# Patient Record
Sex: Male | Born: 1970 | Race: White | Hispanic: No | Marital: Single | Smoking: Current every day smoker
Health system: Southern US, Community
[De-identification: ages and names within clinical notes are randomized; demographics above are authoritative.]

## PROBLEM LIST (undated history)

## (undated) DIAGNOSIS — F1910 Other psychoactive substance abuse, uncomplicated: Secondary | ICD-10-CM

## (undated) HISTORY — PX: HERNIA REPAIR: SHX51

---

## 2016-08-27 ENCOUNTER — Encounter (HOSPITAL_COMMUNITY): Payer: Self-pay | Admitting: Emergency Medicine

## 2016-08-27 ENCOUNTER — Emergency Department (HOSPITAL_COMMUNITY)
Admission: EM | Admit: 2016-08-27 | Discharge: 2016-08-27 | Disposition: A | Discharge status: Home / Self Care | Payer: Self-pay | Attending: Emergency Medicine | Admitting: Emergency Medicine

## 2016-08-27 DIAGNOSIS — Y999 Unspecified external cause status: Secondary | ICD-10-CM | POA: Insufficient documentation

## 2016-08-27 DIAGNOSIS — W260XXA Contact with knife, initial encounter: Secondary | ICD-10-CM | POA: Insufficient documentation

## 2016-08-27 DIAGNOSIS — F17210 Nicotine dependence, cigarettes, uncomplicated: Secondary | ICD-10-CM | POA: Insufficient documentation

## 2016-08-27 DIAGNOSIS — Z23 Encounter for immunization: Secondary | ICD-10-CM | POA: Insufficient documentation

## 2016-08-27 DIAGNOSIS — Y929 Unspecified place or not applicable: Secondary | ICD-10-CM | POA: Insufficient documentation

## 2016-08-27 DIAGNOSIS — S91111A Laceration without foreign body of right great toe without damage to nail, initial encounter: Secondary | ICD-10-CM | POA: Insufficient documentation

## 2016-08-27 DIAGNOSIS — Y939 Activity, unspecified: Secondary | ICD-10-CM | POA: Insufficient documentation

## 2016-08-27 MED ORDER — LIDOCAINE HCL (PF) 1 % IJ SOLN
INTRAMUSCULAR | Status: AC
  Filled 2016-08-27: qty 5

## 2016-08-27 MED ORDER — TETANUS-DIPHTH-ACELL PERTUSSIS 5-2.5-18.5 LF-MCG/0.5 IM SUSP
0.5000 mL | Freq: Once | INTRAMUSCULAR | Status: AC
  Administered 2016-08-27: 0.5 mL via INTRAMUSCULAR
  Filled 2016-08-27: qty 0.5

## 2016-08-27 MED ORDER — CEPHALEXIN 500 MG PO CAPS: 500.0000 mg | ORAL_CAPSULE | Freq: Four times a day (QID) | ORAL | 0 refills | Status: DC

## 2016-08-27 MED ORDER — LIDOCAINE HCL (PF) 1 % IJ SOLN
30.0000 mL | Freq: Once | INTRAMUSCULAR | Status: AC
  Administered 2016-08-27: 30 mL
  Filled 2016-08-27: qty 30

## 2016-08-27 MED ORDER — BACITRACIN ZINC 500 UNIT/GM EX OINT
TOPICAL_OINTMENT | CUTANEOUS | Status: AC
  Filled 2016-08-27: qty 0.9

## 2016-08-27 NOTE — Discharge Instructions (Signed)
Take the Keflex as prescribed and be sure to complete the entire 5 day course. Return in 12-14 days to have the sutures removed. Keep the wound clean and dry and use antibiotic ointment on it for roughly 4 days. Return to emergency department sooner if you experience signs of infection to include redness, warmth, swelling, pain, discharge, red streaks of or from around your wound, or fever.

## 2016-08-27 NOTE — ED Triage Notes (Signed)
Pt has laceration on bottom of right great toe.  States it is from a knife he stuck in the ground and forgot about.

## 2016-08-27 NOTE — ED Notes (Signed)
Pt has laceration to bottom of right great toe, bleeding is controlled.

## 2016-08-27 NOTE — ED Provider Notes (Signed)
[facility] Provider Note   CSN: [id number] Arrival date & time: 08/27/16  1544   By signing my name below, I, [name], attest that this documentation has been prepared under the direction and in the presence of [name], PA-C. Electronically signed by: [name], ED Scribe. 08/27/16. 6:13 PM.  History   Chief Complaint Chief Complaint  Patient presents with  . Laceration   The history is provided by the patient. No language interpreter was used.   HPI Comments:  Derek Shields is a 45 y.o. male who presents to the Emergency Department complaining of laceration to right great toe, which occurred 2-3 hours ago. Laceration localized to inferior portion of toe. Pt reports that he stuck a knife in the ground and forgot about it and then cut his foot on it. Associated symptoms include controlled bleeding and tingling. Denies pain to affected area, numbness, fevers, chills or vomiting. Pt is unsure of last tetanus vaccination.  History reviewed. No pertinent past medical history.  There are no active problems to display for this patient.   History reviewed. No pertinent surgical history.     Home Medications    Prior to Admission medications   Medication Sig Start Date End Date Taking? Authorizing Provider  cephALEXin (KEFLEX) 500 MG capsule Take 1 capsule (500 mg total) by mouth 4 (four) times daily. 08/27/16   Jerre Simon, PA    Family History History reviewed. No pertinent family history.  Social History Social History  Substance Use Topics  . Smoking status: Current Every Day Smoker    Packs/day: 1.00    Types: Cigarettes  . Smokeless tobacco: Not on file  . Alcohol use Yes     Comment: daily     Allergies   Review of patient's allergies indicates no known allergies.   Review of Systems Review of Systems  Constitutional: Negative for chills and fever.  Gastrointestinal: Negative for vomiting.  Skin: Positive for wound ( laceration to right  great toe).  Neurological: Negative for numbness.     Physical Exam Updated Vital Signs BP 156/91 (BP Location: Left Arm)   Pulse 92   Temp 98.6 F (37 C) (Oral)   Resp 16   Ht 5\' 10"  (1.778 m)   Wt 180 lb (81.6 kg)   SpO2 99%   BMI 25.83 kg/m   Physical Exam  Constitutional: He appears well-developed and well-nourished. No distress.  HENT:  Head: Normocephalic and atraumatic.  Eyes: Conjunctivae are normal.  Cardiovascular:  Pulses:      Dorsalis pedis pulses are 2+ on the right side, and 2+ on the left side.  Pulmonary/Chest: Effort normal. No respiratory distress.  Musculoskeletal: Normal range of motion.  Neurological: He is alert. Coordination normal.  Sensations intact. Pt neurovascularly intact.  Skin: Skin is warm and dry. He is not diaphoretic.  3 cm laceration to plantar aspect of right great toe, bleeding controlled, no signs of surrounding infection.  Psychiatric: He has a normal mood and affect. His behavior is normal.  Nursing note and vitals reviewed.    ED Treatments / Results  DIAGNOSTIC STUDIES:  Oxygen Saturation is 99% on room air, normal by my interpretation.    COORDINATION OF CARE:  5:06 PM Discussed treatment plan with pt at bedside, which includes irrigation and sutures, and pt agreed to plan. Will update tetanus vaccination.  Labs (all labs ordered are listed, but only abnormal results are displayed) Labs Reviewed - No data to display  EKG  EKG Interpretation None       Radiology No results found.  Procedures .Marland Kitchen.Laceration Repair Date/Time: 08/27/2016 6:09 PM Performed by: Jerre SimonFOCHT, JESSICA L Authorized by: Glynn OctaveANCOUR, STEPHEN   Consent:    Consent obtained:  Verbal   Consent given by:  Patient   Risks discussed:  Infection (bleeding)   Alternatives discussed: Dermabond. Anesthesia (see MAR for exact dosages):    Anesthesia method:  Local infiltration   Local anesthetic:  Lidocaine 1% w/o epi Laceration details:    Location:   Toe   Toe location:  R big toe   Length (cm):  3 Repair type:    Repair type:  Simple Pre-procedure details:    Preparation:  Patient was prepped and draped in usual sterile fashion Exploration:    Wound exploration: wound explored through full range of motion and entire depth of wound probed and visualized     Contaminated: no   Treatment:    Area cleansed with:  Betadine   Amount of cleaning:  Standard   Irrigation solution:  Sterile saline   Irrigation method:  Syringe Mucous membrane repair:    Wound mucous membrane closure material used: Prolene. Skin repair:    Repair method:  Sutures   Suture size:  3-0   Suture material:  Prolene   Suture technique:  Simple interrupted   Number of sutures:  6 Approximation:    Approximation:  Close   Vermilion border: well-aligned   Post-procedure details:    Dressing:  Antibiotic ointment and sterile dressing   Patient tolerance of procedure:  Tolerated well, no immediate complications   (including critical care time)  Medications Ordered in ED Medications  lidocaine (PF) (XYLOCAINE) 1 % injection (not administered)  bacitracin 500 UNIT/GM ointment (not administered)  lidocaine (PF) (XYLOCAINE) 1 % injection 30 mL (30 mLs Infiltration Given by Other 08/27/16 1812)  Tdap (BOOSTRIX) injection 0.5 mL (0.5 mLs Intramuscular Given 08/27/16 1809)     Initial Impression / Assessment and Plan / ED Course  I have reviewed the triage vital signs and the nursing notes.  Pertinent labs & imaging results that were available during my care of the patient were reviewed by me and considered in my medical decision making (see chart for details).  Clinical Course   Tdap booster given.Pressure irrigation performed. Laceration occurred < 8 hours prior to repair which was well tolerated. Pt has no co morbidities to effect normal wound healing. Pt is a smoker so will d/c with 5 days of Keflex and because lac is on his toe. Discussed suture home care w  pt and answered questions. Pt to f-u for wound check and suture removal in 12 days. Discussed strict return precautions to include signs of infection. Pt is hemodynamically stable w no complaints prior to dc and expresses understanding to the discharge instructions.  I personally performed the services described in this documentation, which was scribed in my presence. The recorded information has been reviewed and is accurate.    Final Clinical Impressions(s) / ED Diagnoses   Final diagnoses:  Laceration of right great toe, initial encounter    New Prescriptions Discharge Medication List as of 08/27/2016  6:14 PM    START taking these medications   Details  cephALEXin (KEFLEX) 500 MG capsule Take 1 capsule (500 mg total) by mouth 4 (four) times daily., Starting Mon 08/27/2016, Print          Joyce CopaJessica L HalaulaFocht, GeorgiaPA 08/27/16 1904    Glynn OctaveStephen Rancour, MD 08/28/16 [phone]

## 2017-01-03 ENCOUNTER — Encounter (HOSPITAL_COMMUNITY): Payer: Self-pay | Admitting: *Deleted

## 2017-01-03 ENCOUNTER — Emergency Department (HOSPITAL_COMMUNITY)
Admission: EM | Admit: 2017-01-03 | Discharge: 2017-01-03 | Disposition: A | Discharge status: Home / Self Care | Payer: Self-pay | Attending: Emergency Medicine | Admitting: Emergency Medicine

## 2017-01-03 DIAGNOSIS — Y999 Unspecified external cause status: Secondary | ICD-10-CM | POA: Insufficient documentation

## 2017-01-03 DIAGNOSIS — W25XXXA Contact with sharp glass, initial encounter: Secondary | ICD-10-CM | POA: Insufficient documentation

## 2017-01-03 DIAGNOSIS — F1290 Cannabis use, unspecified, uncomplicated: Secondary | ICD-10-CM | POA: Insufficient documentation

## 2017-01-03 DIAGNOSIS — Y929 Unspecified place or not applicable: Secondary | ICD-10-CM | POA: Insufficient documentation

## 2017-01-03 DIAGNOSIS — F17210 Nicotine dependence, cigarettes, uncomplicated: Secondary | ICD-10-CM | POA: Insufficient documentation

## 2017-01-03 DIAGNOSIS — Y9389 Activity, other specified: Secondary | ICD-10-CM | POA: Insufficient documentation

## 2017-01-03 DIAGNOSIS — S81811A Laceration without foreign body, right lower leg, initial encounter: Secondary | ICD-10-CM | POA: Insufficient documentation

## 2017-01-03 DIAGNOSIS — Z792 Long term (current) use of antibiotics: Secondary | ICD-10-CM | POA: Insufficient documentation

## 2017-01-03 MED ORDER — LIDOCAINE HCL (PF) 2 % IJ SOLN
10.0000 mL | Freq: Once | INTRAMUSCULAR | Status: AC
  Administered 2017-01-03: 10 mL via INTRADERMAL
  Filled 2017-01-03: qty 10

## 2017-01-03 NOTE — ED Notes (Signed)
Called to place patient in room. No answer. Patient not in waiting room at this time.

## 2017-01-03 NOTE — ED Notes (Signed)
Called to place patient in room. No answer. Patient not in waiting area.

## 2017-01-03 NOTE — ED Provider Notes (Signed)
[facility] Provider Note   CSN: [id number] Arrival date & time: 01/03/17  1545     History   Chief Complaint Chief Complaint  Patient presents with  . Extremity Laceration    HPI Derek Shields is a 46 y.o. male.  HPI   Derek Shields is a 46 y.o. male who presents to the Emergency Department complaining of laceration to his right lower leg from a piece of broken glass. Incident occurred shortly before ER arrival. He denies pain, swelling, or numbness or difficulty flexing or extending his foot. Last Td was last year. He denies foreign bodies or use of blood thinners. Patient admits to drinking alcohol earlier today.  History reviewed. No pertinent past medical history.  There are no active problems to display for this patient.   Past Surgical History:  Procedure Laterality Date  . HERNIA REPAIR         Home Medications    Prior to Admission medications   Medication Sig Start Date End Date Taking? Authorizing Provider  cephALEXin (KEFLEX) 500 MG capsule Take 1 capsule (500 mg total) by mouth 4 (four) times daily. 08/27/16   Jerre SimonJessica L Focht, PA    Family History No family history on file.  Social History Social History  Substance Use Topics  . Smoking status: Current Every Day Smoker    Packs/day: 1.50    Types: Cigarettes  . Smokeless tobacco: Never Used  . Alcohol use Yes     Comment: daily     Allergies   Patient has no known allergies.   Review of Systems Review of Systems  Constitutional: Negative for chills and fever.  Musculoskeletal: Negative for arthralgias, back pain and joint swelling.  Skin: Positive for wound.       Laceration right lower leg  Neurological: Negative for dizziness, weakness and numbness.  Hematological: Does not bruise/bleed easily.  All other systems reviewed and are negative.    Physical Exam Updated Vital Signs BP 134/65 (BP Location: Left Arm)   Pulse 78   Temp 98.1 F (36.7 C) (Oral)   Resp 18   Ht 5'  10" (1.778 m)   Wt 77.1 kg   SpO2 97%   BMI 24.39 kg/m   Physical Exam  Constitutional: He is oriented to person, place, and time. He appears well-developed and well-nourished. No distress.  HENT:  Head: Normocephalic and atraumatic.  Cardiovascular: Normal rate, regular rhythm and intact distal pulses.   No murmur heard. Pulmonary/Chest: Effort normal and breath sounds normal. No respiratory distress.  Musculoskeletal: Normal range of motion. He exhibits tenderness. He exhibits no edema.  4 cm laceration to the posterior right lower leg.  Bleeding controlled.  Wound explored, no muscle or tendon injury observed.  Achilles tendon appears intact  Neurological: He is alert and oriented to person, place, and time. He exhibits normal muscle tone. Coordination normal.  Skin: Skin is warm.  Nursing note and vitals reviewed.    ED Treatments / Results  Labs (all labs ordered are listed, but only abnormal results are displayed) Labs Reviewed - No data to display  EKG  EKG Interpretation None       Radiology No results found.  Procedures Procedures (including critical care time)  LACERATION REPAIR Performed by: Leina Babe L. Authorized by: Maxwell CaulRIPLETT,Icker Swigert L. Consent: Verbal consent obtained. Risks and benefits: risks, benefits and alternatives were discussed Consent given by: patient Patient identity confirmed: provided demographic data Prepped and Draped in normal sterile fashion Wound explored  Laceration Location:  right lower leg  Laceration Length: 4 cm  No Foreign Bodies seen or palpated  Anesthesia: local infiltration  Local anesthetic: lidocaine 2 % w/o epinephrine  Anesthetic total: 3 ml  Irrigation method: syringe Amount of cleaning: standard  Skin closure: staples  Number of staples: 10   Technique: stapling  Patient tolerance: Patient tolerated the procedure well with no immediate complications.   Medications Ordered in ED Medications    lidocaine (XYLOCAINE) 2 % injection 10 mL (not administered)     Initial Impression / Assessment and Plan / ED Course  I have reviewed the triage vital signs and the nursing notes.  Pertinent labs & imaging results that were available during my care of the patient were reviewed by me and considered in my medical decision making (see chart for details).  Clinical Course     Patient smells of alcohol, but does not appear intoxicated.   vital signs are stable. He has contacted a family member to drive him home.  Td up to date. Wound was explored prior to closure, no muscle or tendon injury seen or palpated. Wound was bandaged. Patient stable for discharge agrees to wound care instructions and staples out in 10 days. patient ambulates with steady gait.    Agrees to use Tylenol or ibuprofen for pain.  Final Clinical Impressions(s) / ED Diagnoses   Final diagnoses:  Laceration of right lower leg, initial encounter    New Prescriptions New Prescriptions   No medications on file     Pauline Aus, Cordelia Poche 01/03/17 1813    Linwood Dibbles, MD 01/07/17 2200

## 2017-01-03 NOTE — ED Triage Notes (Signed)
Pt comes in for right calf laceration. Pt states he stuck his leg through a door then he said he did it cutting wood. Pt has ETOH on board. Bleeding is controlled and leg is wrapped in triage.

## 2017-01-03 NOTE — Discharge Instructions (Signed)
Clean the wound with mild soap and water.  Keep the wound clean and bandaged.  Staples out in 10 days

## 2017-01-16 ENCOUNTER — Emergency Department (HOSPITAL_COMMUNITY)
Admission: EM | Admit: 2017-01-16 | Discharge: 2017-01-16 | Disposition: A | Discharge status: Home / Self Care | Payer: Self-pay | Attending: Emergency Medicine | Admitting: Emergency Medicine

## 2017-01-16 ENCOUNTER — Encounter (HOSPITAL_COMMUNITY): Payer: Self-pay | Admitting: Emergency Medicine

## 2017-01-16 DIAGNOSIS — Z4802 Encounter for removal of sutures: Secondary | ICD-10-CM | POA: Insufficient documentation

## 2017-01-16 DIAGNOSIS — F17210 Nicotine dependence, cigarettes, uncomplicated: Secondary | ICD-10-CM | POA: Insufficient documentation

## 2017-01-16 NOTE — ED Notes (Signed)
RN removed 10 staples at bedside.

## 2017-01-16 NOTE — Discharge Instructions (Signed)
Clean with mild soap and water and keep it bandaged when working.  Return if needed

## 2017-01-16 NOTE — ED Triage Notes (Signed)
Here to have staples removed from right leg.

## 2017-01-19 NOTE — ED Provider Notes (Signed)
[facility] Provider Note   CSN: [id number] Arrival date & time: 01/16/17  1842     History   Chief Complaint Chief Complaint  Patient presents with  . Suture / Staple Removal    HPI Derek Shields is a 46 y.o. male.  HPI   Derek Shields is a 46 y.o. male who presents to the Emergency Department requesting staple removal.  He was seen here on 01/03/17 and treated by me for a laceration to the posterior right leg.  He complains of redness around the staples.  He denies bleeding, drainage, and swelling.  He has been cleaning the area with soap and water and applying a topical first aid spray.   History reviewed. No pertinent past medical history.  There are no active problems to display for this patient.   Past Surgical History:  Procedure Laterality Date  . HERNIA REPAIR         Home Medications    Prior to Admission medications   Medication Sig Start Date End Date Taking? Authorizing Provider  cephALEXin (KEFLEX) 500 MG capsule Take 1 capsule (500 mg total) by mouth 4 (four) times daily. 08/27/16   Jerre SimonJessica L Focht, PA    Family History History reviewed. No pertinent family history.  Social History Social History  Substance Use Topics  . Smoking status: Current Every Day Smoker    Packs/day: 1.50    Types: Cigarettes  . Smokeless tobacco: Never Used  . Alcohol use Yes     Comment: daily     Allergies   Patient has no known allergies.   Review of Systems Review of Systems  Constitutional: Negative for chills and fever.  Musculoskeletal: Negative for arthralgias, back pain and joint swelling.  Skin: Positive for wound.       Laceration right posterior left with staples in place  Neurological: Negative for dizziness, weakness and numbness.  Hematological: Does not bruise/bleed easily.  All other systems reviewed and are negative.    Physical Exam Updated Vital Signs BP 138/93 (BP Location: Left Arm)   Pulse 72   Resp 15   Ht 5\' 10"  (1.778 m)    Wt 77.1 kg   SpO2 100%   BMI 24.39 kg/m   Physical Exam  Constitutional: He is oriented to person, place, and time. He appears well-developed and well-nourished. No distress.  HENT:  Head: Normocephalic and atraumatic.  Cardiovascular: Normal rate, regular rhythm and intact distal pulses.   No murmur heard. Pulmonary/Chest: Effort normal and breath sounds normal. No respiratory distress.  Musculoskeletal: He exhibits no edema or tenderness.  Neurological: He is alert and oriented to person, place, and time. He exhibits normal muscle tone. Coordination normal.  Skin: Skin is warm. Laceration noted.  Laceration to right posterior lower leg with staples in place.  Appears well healed.  Mild surrounding erythema w/o edema or drainage.  Nursing note and vitals reviewed.    ED Treatments / Results  Labs (all labs ordered are listed, but only abnormal results are displayed) Labs Reviewed - No data to display  EKG  EKG Interpretation None       Radiology No results found.  Procedures Procedures (including critical care time)  Medications Ordered in ED Medications - No data to display   Initial Impression / Assessment and Plan / ED Course  I have reviewed the triage vital signs and the nursing notes.  Pertinent labs & imaging results that were available during my care of the patient were reviewed by  me and considered in my medical decision making (see chart for details).     Staples removed by nursing staff w/o difficulty.,  Erythema likely result of delayed staple removal.  NV intact.  Wound care instructions given.  Final Clinical Impressions(s) / ED Diagnoses   Final diagnoses:  Encounter for staple removal    New Prescriptions Discharge Medication List as of 01/16/2017  7:53 PM       Arriyana Rodell Trisha Mangle, PA-C 01/19/17 1732    Marily Memos, MD 01/20/17 [phone]

## 2017-07-27 ENCOUNTER — Encounter (HOSPITAL_COMMUNITY): Payer: Self-pay

## 2017-07-27 ENCOUNTER — Emergency Department (HOSPITAL_COMMUNITY): Payer: Self-pay

## 2017-07-27 ENCOUNTER — Emergency Department (HOSPITAL_COMMUNITY)
Admission: EM | Admit: 2017-07-27 | Discharge: 2017-07-28 | Disposition: A | Discharge status: Home / Self Care | Payer: Self-pay | Attending: Emergency Medicine | Admitting: Emergency Medicine

## 2017-07-27 DIAGNOSIS — W28XXXA Contact with powered lawn mower, initial encounter: Secondary | ICD-10-CM | POA: Insufficient documentation

## 2017-07-27 DIAGNOSIS — F17210 Nicotine dependence, cigarettes, uncomplicated: Secondary | ICD-10-CM | POA: Insufficient documentation

## 2017-07-27 DIAGNOSIS — Y999 Unspecified external cause status: Secondary | ICD-10-CM | POA: Insufficient documentation

## 2017-07-27 DIAGNOSIS — Y929 Unspecified place or not applicable: Secondary | ICD-10-CM | POA: Insufficient documentation

## 2017-07-27 DIAGNOSIS — S61212A Laceration without foreign body of right middle finger without damage to nail, initial encounter: Secondary | ICD-10-CM | POA: Insufficient documentation

## 2017-07-27 DIAGNOSIS — Y9389 Activity, other specified: Secondary | ICD-10-CM | POA: Insufficient documentation

## 2017-07-27 MED ORDER — LIDOCAINE HCL (PF) 1 % IJ SOLN
5.0000 mL | Freq: Once | INTRAMUSCULAR | Status: DC
  Filled 2017-07-27: qty 5

## 2017-07-27 MED ORDER — POVIDONE-IODINE 10 % EX SOLN
CUTANEOUS | Status: AC
  Filled 2017-07-27: qty 15

## 2017-07-27 NOTE — ED Notes (Signed)
Pt lifted flap on mower & blade hit the tip of his middle finger.

## 2017-07-27 NOTE — ED Triage Notes (Addendum)
Reports of cutting right middle index finger with lawn mower blade this evening. Denies pain. Bleeding controlled. Patient has ETOH on board.

## 2017-07-27 NOTE — ED Provider Notes (Signed)
[facility] Provider Note   CSN: [id number] Arrival date & time: 07/27/17  2249     History   Chief Complaint Chief Complaint  Patient presents with  . Laceration    HPI Derek Balleraul Tubbs is a 46 y.o. male.  HPI   Derek Balleraul Shields is a 46 y.o. male who presents to the Emergency Department complaining of laceration to the distal tip of the right middle finger.  States that he was working on a Charity fundraiserlawn mower blade when the incident occurred earlier this evening.  He denies numbness or pain to the finger, no hx of blood thinners.  Td is up to date. 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     History reviewed. No pertinent past medical history.  There are no active problems to display for this patient.   Past Surgical History:  Procedure Laterality Date  . HERNIA REPAIR         Home Medications    Prior to Admission medications   Medication Sig Start Date End Date Taking? Authorizing Provider  cephALEXin (KEFLEX) 500 MG capsule Take 1 capsule (500 mg total) by mouth 4 (four) times daily. 08/27/16   Jerre SimonFocht, Jessica L, PA    Family History No family history on file.  Social History Social History  Substance Use Topics  . Smoking status: Current Every Day Smoker    Packs/day: 1.50    Types: Cigarettes  . Smokeless tobacco: Never Used  . Alcohol use Yes     Comment: daily     Allergies   Patient has no known allergies.   Review of Systems Review of Systems  Constitutional: Negative for chills and fever.  Musculoskeletal: Negative for arthralgias, back pain and joint swelling.  Skin: Positive for wound.       Laceration   Neurological: Negative for dizziness, weakness and numbness.  Hematological: Does not bruise/bleed easily.  All other systems reviewed and are negative.    Physical Exam Updated Vital Signs BP (!) 133/102   Pulse (!) 113   Temp 99 F (37.2 C) (Oral)   Resp 15    Ht 5\' 10"  (1.778 m)   Wt 74.8 kg (165 lb)   SpO2 96%   BMI 23.68 kg/m   Physical Exam  Constitutional: He is oriented to person, place, and time. He appears well-developed and well-nourished. No distress.  HENT:  Head: Normocephalic and atraumatic.  Cardiovascular: Normal rate, regular rhythm and intact distal pulses.   Pulmonary/Chest: Effort normal and breath sounds normal. No respiratory distress.  Musculoskeletal: Normal range of motion. He exhibits no edema or tenderness.  Neurological: He is alert and oriented to person, place, and time. No sensory deficit. He exhibits normal muscle tone. Coordination normal.  Skin: Skin is warm. Capillary refill takes less than 2 seconds. Laceration noted.  2 cm Flap type laceration to the distal tip of the right middle finger.  No FB's or nail involvement.  Bleeding controlled.  Pt has full ROM of the finger.    Nursing note and vitals reviewed.    ED Treatments / Results  Labs (all labs ordered are listed, but only abnormal results are displayed) Labs Reviewed - No data to display  EKG  EKG Interpretation None       Radiology Dg Finger Middle Right  Result Date: 07/27/2017 CLINICAL DATA:  Laceration to the right middle finger from lawnmower blade, acute onset.  Initial encounter. EXAM: RIGHT MIDDLE FINGER 2+V COMPARISON:  None. FINDINGS: The known soft tissue laceration is not well characterized on radiograph. No radiopaque foreign bodies are seen. There is no definite evidence of osseous disruption. Visualized joint spaces are preserved. IMPRESSION: No radiopaque foreign bodies seen. No definite evidence of osseous disruption. Electronically Signed   By: Roanna RaiderJeffery  Chang M.D.   On: 07/27/2017 23:26    Procedures Procedures (including critical care time)  LACERATION REPAIR Performed by: Allan Minotti L. Authorized by: Maxwell CaulRIPLETT,Jamon Hayhurst L. Consent: Verbal consent obtained. Risks and benefits: risks, benefits and alternatives were  discussed Consent given by: patient Patient identity confirmed: provided demographic data Prepped and Draped in normal sterile fashion Wound explored  Laceration Location: distal tip of right middle finger  Laceration Length: 2 cm  No Foreign Bodies seen or palpated  Anesthesia: local infiltration  Local anesthetic: lidocaine 1 % w/o epinephrine  Anesthetic total: 2 ml  Irrigation method: syringe Amount of cleaning: standard  Skin closure: 4-0 ethilon  Number of sutures: 3   Technique: simple interrupted  Patient tolerance: Patient tolerated the procedure well with no immediate complications.   Medications Ordered in ED Medications  lidocaine (PF) (XYLOCAINE) 1 % injection 5 mL (not administered)     Initial Impression / Assessment and Plan / ED Course  I have reviewed the triage vital signs and the nursing notes.  Pertinent labs & imaging results that were available during my care of the patient were reviewed by me and considered in my medical decision making (see chart for details).     Flap type lac to distal tip of the finger.  NV intact.  No motor deficits. Bleeding controlled.  Td up to date.    Wound loosely approximated.  Finger splint applied.  Wound care instructions discussed.  Sutures out in 7 days.   Final Clinical Impressions(s) / ED Diagnoses   Final diagnoses:  Laceration of right middle finger without foreign body without damage to nail, initial encounter    New Prescriptions New Prescriptions   No medications on file     Pauline Ausriplett, Lavontay Kirk, Cordelia Poche-C 07/28/17 0049    Dione BoozeGlick, David, MD 07/28/17 [phone]

## 2017-07-28 MED ORDER — SULFAMETHOXAZOLE-TRIMETHOPRIM 800-160 MG PO TABS: 1.0000 | ORAL_TABLET | Freq: Two times a day (BID) | ORAL | 0 refills | Status: AC

## 2017-07-28 MED ORDER — BACITRACIN ZINC 500 UNIT/GM EX OINT
TOPICAL_OINTMENT | CUTANEOUS | Status: DC
Start: 2017-07-28 — End: 2017-07-28
  Filled 2017-07-28: qty 0.9

## 2017-07-28 NOTE — ED Notes (Signed)
antibiotic ointment applied & sterile dressing to the right middle finger.

## 2017-07-28 NOTE — Discharge Instructions (Signed)
Clean the wound with mild soap and water.  Keep it bandaged, splint as needed.  Sutures out in 10 days.  Return here for any signs of infection

## 2017-07-28 NOTE — ED Notes (Signed)
Pt alert & oriented x4, stable gait. Patient given discharge instructions, paperwork & prescription(s). Patient  instructed to stop at the registration desk to finish any additional paperwork. Patient verbalized understanding. Pt left department w/ no further questions. 

## 2018-01-11 IMAGING — DX DG FINGER MIDDLE 2+V*R*
3 series · 3 of 3 positions shown · non-contrast
Comparison: None.

CLINICAL DATA: Laceration to the right middle finger from lawnmower
blade, acute onset. Initial encounter.

EXAM:
RIGHT MIDDLE FINGER 2+V

[finger obl]
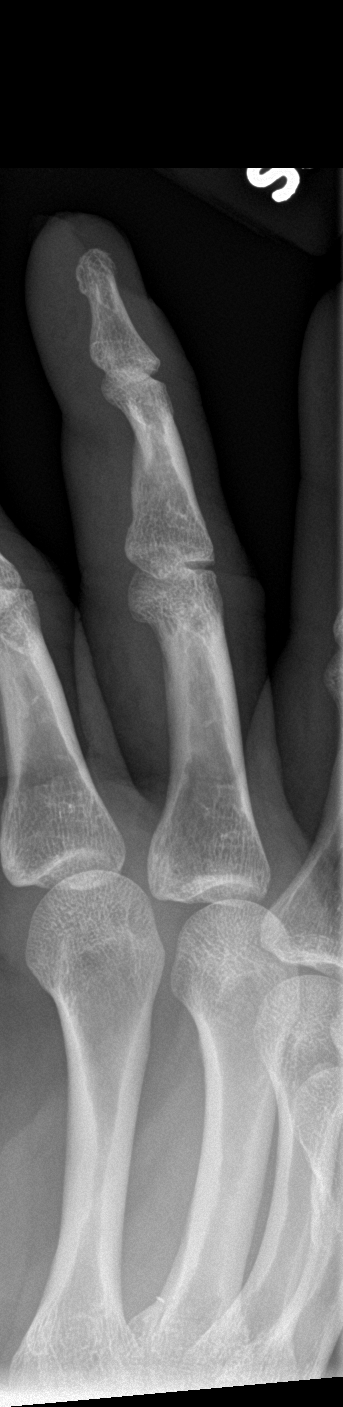

[finger lat]
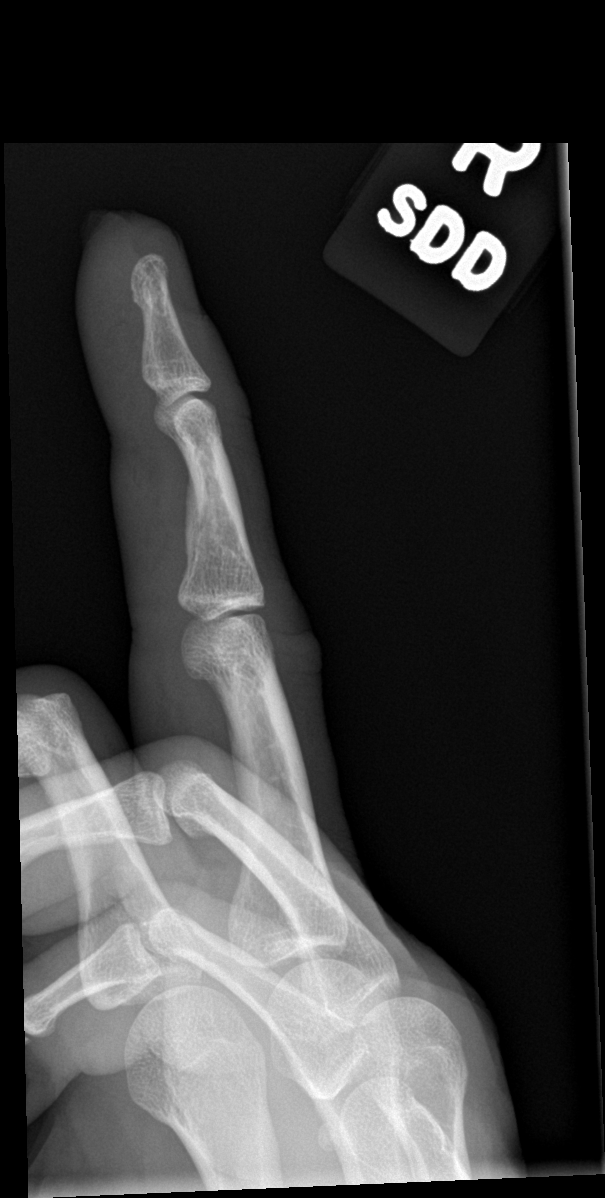

[finger ap]
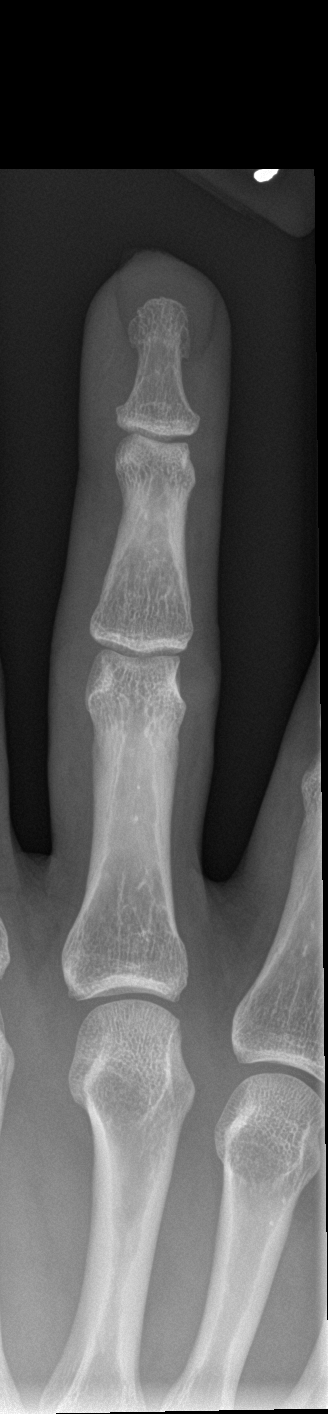

[3 of 3 positions shown; findings below may reference images not displayed]

FINDINGS: The known soft tissue laceration is not well characterized on
radiograph. No radiopaque foreign bodies are seen. There is no
definite evidence of osseous disruption. Visualized joint spaces are
preserved.
IMPRESSION: No radiopaque foreign bodies seen. No definite evidence of osseous
disruption.

## 2019-05-25 ENCOUNTER — Encounter (HOSPITAL_COMMUNITY): Payer: Self-pay | Admitting: Emergency Medicine

## 2019-05-25 ENCOUNTER — Emergency Department (HOSPITAL_COMMUNITY): Payer: Self-pay

## 2019-05-25 ENCOUNTER — Other Ambulatory Visit: Payer: Self-pay

## 2019-05-25 ENCOUNTER — Emergency Department (HOSPITAL_COMMUNITY)
Admission: EM | Admit: 2019-05-25 | Discharge: 2019-05-25 | Disposition: A | Discharge status: Home / Self Care | Payer: Self-pay | Attending: Emergency Medicine | Admitting: Emergency Medicine

## 2019-05-25 DIAGNOSIS — F17210 Nicotine dependence, cigarettes, uncomplicated: Secondary | ICD-10-CM | POA: Insufficient documentation

## 2019-05-25 DIAGNOSIS — F1210 Cannabis abuse, uncomplicated: Secondary | ICD-10-CM | POA: Insufficient documentation

## 2019-05-25 DIAGNOSIS — R0789 Other chest pain: Secondary | ICD-10-CM | POA: Insufficient documentation

## 2019-05-25 DIAGNOSIS — F1410 Cocaine abuse, uncomplicated: Secondary | ICD-10-CM | POA: Insufficient documentation

## 2019-05-25 LAB — BASIC METABOLIC PANEL
Anion gap: 11 (ref 5–15)
BUN: 10 mg/dL (ref 6–20)
CO2: 22 mmol/L (ref 22–32)
Calcium: 9 mg/dL (ref 8.9–10.3)
Chloride: 110 mmol/L (ref 98–111)
Creatinine, Ser: 0.72 mg/dL (ref 0.61–1.24)
GFR calc Af Amer: >60 mL/min (ref >60)
GFR calc non Af Amer: >60 mL/min (ref >60)
Glucose, Bld: 84 mg/dL (ref 70–99)
Potassium: 4 mmol/L (ref 3.5–5.1)
Sodium: 143 mmol/L (ref 135–145)

## 2019-05-25 LAB — CBC
HCT: 43.9 % (ref 39.0–52.0)
Hemoglobin: 15 g/dL (ref 13.0–17.0)
MCH: 34.1 pg — ABNORMAL HIGH (ref 26.0–34.0)
MCHC: 34.2 g/dL (ref 30.0–36.0)
MCV: 99.8 fL (ref 80.0–100.0)
Platelets: 305 10*3/uL (ref 150–400)
RBC: 4.4 MIL/uL (ref 4.22–5.81)
RDW: 14 % (ref 11.5–15.5)
WBC: 8.9 10*3/uL (ref 4.0–10.5)
nRBC: 0 % (ref 0.0–0.2)

## 2019-05-25 LAB — DIFFERENTIAL
Abs Immature Granulocytes: 0.04 10*3/uL (ref 0.00–0.07)
Basophils Absolute: 0.1 10*3/uL (ref 0.0–0.1)
Basophils Relative: 1 %
Eosinophils Absolute: 0.4 10*3/uL (ref 0.0–0.5)
Eosinophils Relative: 5 %
Immature Granulocytes: 1 %
Lymphocytes Relative: 32 %
Lymphs Abs: 2.8 10*3/uL (ref 0.7–4.0)
Monocytes Absolute: 1 10*3/uL (ref 0.1–1.0)
Monocytes Relative: 12 %
Neutro Abs: 4.4 10*3/uL (ref 1.7–7.7)
Neutrophils Relative %: 49 %

## 2019-05-25 LAB — HEPATIC FUNCTION PANEL
ALT: 25 U/L (ref 0–44)
AST: 32 U/L (ref 15–41)
Albumin: 3.8 g/dL (ref 3.5–5.0)
Alkaline Phosphatase: 39 U/L (ref 38–126)
Bilirubin, Direct: 0.1 mg/dL (ref 0.0–0.2)
Indirect Bilirubin: 0.3 mg/dL (ref 0.3–0.9)
Total Bilirubin: 0.4 mg/dL (ref 0.3–1.2)
Total Protein: 7.4 g/dL (ref 6.5–8.1)

## 2019-05-25 LAB — ETHANOL: Alcohol, Ethyl (B): 281 mg/dL — ABNORMAL HIGH (ref <10)

## 2019-05-25 LAB — TROPONIN I: Troponin I: <0.03 ng/mL (ref <0.03)

## 2019-05-25 MED ORDER — IBUPROFEN 800 MG PO TABS: 800.0000 mg | ORAL_TABLET | Freq: Three times a day (TID) | ORAL | 0 refills | Status: DC | PRN

## 2019-05-25 MED ORDER — ACETAMINOPHEN 325 MG PO TABS
650.0000 mg | ORAL_TABLET | Freq: Once | ORAL | Status: AC
  Administered 2019-05-25: 02:00:00 650 mg via ORAL
  Filled 2019-05-25: qty 2

## 2019-05-25 NOTE — ED Notes (Signed)
Patient transported to X-ray 

## 2019-05-25 NOTE — ED Triage Notes (Signed)
Pt with L sided chest pain that radiates to L. Shoulder and arm.

## 2019-05-25 NOTE — ED Provider Notes (Signed)
[facility]Y [facility]PART[facility] Provider Note   CSN: [id number] Arrival date & time: 05/25/19  0136    History   Chief Complaint Chief Complaint  Patient presents with  . Chest Pain    HPI Derek Shields is a 48 y.o. male.     Patient complains of left-sided chest wall pain with movement  The history is provided by the patient. No language interpreter was used.  Chest Pain  Pain location:  L chest Pain quality: aching   Pain radiates to:  Does not radiate Pain severity:  Moderate Onset quality:  Sudden Timing:  Intermittent Progression:  Worsening Chronicity:  New Context: not breathing   Relieved by:  Nothing Associated symptoms: no abdominal pain, no back pain, no cough, no fatigue and no headache     History reviewed. No pertinent past medical history.  There are no active problems to display for this patient.   Past Surgical History:  Procedure Laterality Date  . HERNIA REPAIR          Home Medications    Prior to Admission medications   Medication Sig Start Date End Date Taking? Authorizing Provider  cephALEXin (KEFLEX) 500 MG capsule Take 1 capsule (500 mg total) by mouth 4 (four) times daily. 08/27/16   Focht, Joyce CopaJessica L, PA  ibuprofen (ADVIL) 800 MG tablet Take 1 tablet (800 mg total) by mouth every 8 (eight) hours as needed. 05/25/19   Bethann BerkshireZammit, Deara Bober, MD    Family History No family history on file.  Social History Social History   Tobacco Use  . Smoking status: Current Every Day Smoker    Packs/day: 1.50    Types: Cigarettes  . Smokeless tobacco: Never Used  Substance Use Topics  . Alcohol use: Yes    Comment: daily  . Drug use: Yes    Types: Marijuana, Cocaine     Allergies   Patient has no known allergies.   Review of Systems Review of Systems  Constitutional: Negative for appetite change and fatigue.  HENT: Negative for congestion, ear discharge and sinus pressure.   Eyes: Negative for discharge.  Respiratory: Negative for  cough.   Cardiovascular: Positive for chest pain.  Gastrointestinal: Negative for abdominal pain and diarrhea.  Genitourinary: Negative for frequency and hematuria.  Musculoskeletal: Negative for back pain.  Skin: Negative for rash.  Neurological: Negative for seizures and headaches.  Psychiatric/Behavioral: Negative for hallucinations.     Physical Exam Updated Vital Signs BP 123/71   Pulse 66   Temp 98 F (36.7 C) (Oral)   Resp 18   Ht 5\' 11"  (1.803 m)   Wt 77.1 kg   SpO2 96%   BMI 23.71 kg/m   Physical Exam Vitals signs and nursing note reviewed.  Constitutional:      Appearance: He is well-developed.  HENT:     Head: Normocephalic.     Nose: Nose normal.  Eyes:     General: No scleral icterus.    Conjunctiva/sclera: Conjunctivae normal.  Neck:     Musculoskeletal: Neck supple.     Thyroid: No thyromegaly.  Cardiovascular:     Rate and Rhythm: Normal rate and regular rhythm.     Heart sounds: No murmur. No friction rub. No gallop.   Pulmonary:     Breath sounds: No stridor. No wheezing or rales.  Chest:     Chest wall: No tenderness.  Abdominal:     General: There is no distension.     Tenderness: There is no abdominal  tenderness. There is no rebound.  Musculoskeletal: Normal range of motion.     Comments: Tender left chest  Lymphadenopathy:     Cervical: No cervical adenopathy.  Skin:    Findings: No erythema or rash.  Neurological:     Mental Status: He is oriented to person, place, and time.     Motor: No abnormal muscle tone.     Coordination: Coordination normal.  Psychiatric:        Behavior: Behavior normal.      ED Treatments / Results  Labs (all labs ordered are listed, but only abnormal results are displayed) Labs Reviewed  CBC - Abnormal; Notable for the following components:      Result Value   MCH 34.1 (*)    All other components within normal limits  ETHANOL - Abnormal; Notable for the following components:   Alcohol, Ethyl (B)  281 (*)    All other components within normal limits  BASIC METABOLIC PANEL  TROPONIN I  DIFFERENTIAL  HEPATIC FUNCTION PANEL    EKG None  Radiology Dg Chest 2 View  Result Date: 05/25/2019 CLINICAL DATA:  Chest pain EXAM: CHEST - 2 VIEW COMPARISON:  None. FINDINGS: The heart size and mediastinal contours are within normal limits. Both lungs are clear. The visualized skeletal structures are unremarkable. IMPRESSION: No active cardiopulmonary disease. Electronically Signed   By: Deatra Robinson M.D.   On: 05/25/2019 02:27    Procedures Procedures (including critical care time)  Medications Ordered in ED Medications  acetaminophen (TYLENOL) tablet 650 mg (650 mg Oral Given 05/25/19 0212)     Initial Impression / Assessment and Plan / ED Course  I have reviewed the triage vital signs and the nursing notes.  Pertinent labs & imaging results that were available during my care of the patient were reviewed by me and considered in my medical decision making (see chart for details).        Patient left-sided chest pain worse with palpation.  Labs and x-rays unremarkable.  EKG normal.  Suspect chest wall pain.  Patient given Motrin will follow-up  Final Clinical Impressions(s) / ED Diagnoses   Final diagnoses:  Chest wall pain    ED Discharge Orders         Ordered    ibuprofen (ADVIL) 800 MG tablet  Every 8 hours PRN     05/25/19 0318           Bethann Berkshire, MD 05/25/19 0320

## 2019-05-25 NOTE — Discharge Instructions (Addendum)
Follow-up with your doctor if not improving 

## 2019-05-25 NOTE — ED Notes (Signed)
Pt says that he walked here and admits to have been drinking tonight.

## 2020-03-05 ENCOUNTER — Ambulatory Visit: Payer: Self-pay

## 2020-03-06 ENCOUNTER — Ambulatory Visit: Payer: Self-pay | Attending: Internal Medicine

## 2020-03-06 DIAGNOSIS — Z23 Encounter for immunization: Secondary | ICD-10-CM

## 2020-03-06 NOTE — Progress Notes (Signed)
   Covid-19 Vaccination Clinic  Name:  Azarian Starace    MRN: [id number] DOB: Sep 03, 1971  03/06/2020  Mr. Maxson was observed post Covid-19 immunization for 15 minutes without incident. He was provided with Vaccine Information Sheet and instruction to access the V-Safe system.   Mr. Isa was instructed to call 911 with any severe reactions post vaccine: Marland Kitchen Difficulty breathing  . Swelling of face and throat  . A fast heartbeat  . A bad rash all over body  . Dizziness and weakness   Immunizations Administered    Name Date Dose VIS Date Route   Moderna COVID-19 Vaccine 03/06/2020  3:37 PM 0.5 mL 11/24/2019 Intramuscular   Manufacturer: Moderna   Lot: [id number]   NDC: 27129-290-90

## 2020-11-16 ENCOUNTER — Emergency Department (HOSPITAL_COMMUNITY): Payer: Self-pay

## 2020-11-16 ENCOUNTER — Emergency Department (HOSPITAL_COMMUNITY)
Admission: EM | Admit: 2020-11-16 | Discharge: 2020-11-16 | Disposition: A | Discharge status: Home / Self Care | Payer: Self-pay | Attending: Emergency Medicine | Admitting: Emergency Medicine

## 2020-11-16 ENCOUNTER — Encounter (HOSPITAL_COMMUNITY): Payer: Self-pay | Admitting: Emergency Medicine

## 2020-11-16 ENCOUNTER — Other Ambulatory Visit: Payer: Self-pay

## 2020-11-16 DIAGNOSIS — R911 Solitary pulmonary nodule: Secondary | ICD-10-CM

## 2020-11-16 DIAGNOSIS — F17210 Nicotine dependence, cigarettes, uncomplicated: Secondary | ICD-10-CM | POA: Insufficient documentation

## 2020-11-16 DIAGNOSIS — R059 Cough, unspecified: Secondary | ICD-10-CM | POA: Insufficient documentation

## 2020-11-16 DIAGNOSIS — R0789 Other chest pain: Secondary | ICD-10-CM

## 2020-11-16 DIAGNOSIS — R079 Chest pain, unspecified: Secondary | ICD-10-CM

## 2020-11-16 LAB — BASIC METABOLIC PANEL
Anion gap: 9 (ref 5–15)
BUN: 7 mg/dL (ref 6–20)
CO2: 22 mmol/L (ref 22–32)
Calcium: 9 mg/dL (ref 8.9–10.3)
Chloride: 107 mmol/L (ref 98–111)
Creatinine, Ser: 0.99 mg/dL (ref 0.61–1.24)
GFR, Estimated: >60 mL/min (ref >60)
Glucose, Bld: 113 mg/dL — ABNORMAL HIGH (ref 70–99)
Potassium: 3.4 mmol/L — ABNORMAL LOW (ref 3.5–5.1)
Sodium: 138 mmol/L (ref 135–145)

## 2020-11-16 LAB — CBC
HCT: 44.4 % (ref 39.0–52.0)
Hemoglobin: 15.1 g/dL (ref 13.0–17.0)
MCH: 34.2 pg — ABNORMAL HIGH (ref 26.0–34.0)
MCHC: 34 g/dL (ref 30.0–36.0)
MCV: 100.5 fL — ABNORMAL HIGH (ref 80.0–100.0)
Platelets: 285 10*3/uL (ref 150–400)
RBC: 4.42 MIL/uL (ref 4.22–5.81)
RDW: 12.8 % (ref 11.5–15.5)
WBC: 7.6 10*3/uL (ref 4.0–10.5)
nRBC: 0 % (ref 0.0–0.2)

## 2020-11-16 LAB — TROPONIN I (HIGH SENSITIVITY)
Troponin I (High Sensitivity): 5 ng/L (ref <18)
Troponin I (High Sensitivity): 5 ng/L (ref <18)

## 2020-11-16 LAB — D-DIMER, QUANTITATIVE: D-Dimer, Quant: 0.97 ug/mL-FEU — ABNORMAL HIGH (ref 0.00–0.50)

## 2020-11-16 MED ORDER — IOHEXOL 350 MG/ML SOLN
100.0000 mL | Freq: Once | INTRAVENOUS | Status: AC | PRN
  Administered 2020-11-16: 175 mL via INTRAVENOUS

## 2020-11-16 MED ORDER — ASPIRIN 81 MG PO CHEW
324.0000 mg | CHEWABLE_TABLET | Freq: Once | ORAL | Status: AC
  Administered 2020-11-16: 324 mg via ORAL
  Filled 2020-11-16: qty 4

## 2020-11-16 MED ORDER — FAMOTIDINE 20 MG PO TABS: 20.0000 mg | ORAL_TABLET | Freq: Every day | ORAL | 0 refills | Status: DC

## 2020-11-16 NOTE — Discharge Instructions (Addendum)
You were seen in the emergency department for chest pain.  You had blood work EKG chest x-ray and a CAT scan of your chest that did not show an obvious explanation for your pain.  This is likely chest wall pain and should respond to ice and ibuprofen.  Your blood pressure was also elevated here.  The CAT scan did show a pulmonary nodule that will need follow-up.  Included below is the report of the CAT scan.  IMPRESSION:  1. No sign of acute pulmonary embolism.  2. Small basilar pulmonary nodules well-circumscribed without  calcification total of 3 small nodules. In the absence of any  history of neoplasm would recommend a 3 month follow-up for further  assessment. If the nodules are stable at time of repeat CT, then  future CT at 18-24 months (from today's scan) is considered optional  for low-risk patients, but is recommended for high-risk patients.  This recommendation follows the consensus statement: Guidelines for  Management of Incidental Pulmonary Nodules Detected on CT Images:  From the Fleischner Society 2017; Radiology 2017; 284:228-243.  3. Hepatic steatosis.  4. Small hiatal hernia.  5. Aortic atherosclerosis.

## 2020-11-16 NOTE — ED Provider Notes (Signed)
[facility] EMERGENCY DEPARTMENT Provider Note   CSN: [id number] Arrival date & time: 11/16/20  1500     History Chief Complaint  Patient presents with  . Chest Pain    Derek Shields is a 49 y.o. male.  He is here with complaint of sharp chest pain that started about 6 hours ago.  Chest feels tight.  Pain is worse with coughing or sneezing or twisting.  Denies any trauma.  Does smoke and drink alcohol daily.  No known cardiac disease.  No trauma.  Not associated with diaphoresis nausea or vomiting.  No shortness of breath.  Denies any recent cocaine, but has been drinking today  The history is provided by the patient.  Chest Pain Pain location:  Substernal area, L chest and R chest Pain quality: sharp and stabbing   Pain radiates to:  Does not radiate Pain severity:  Severe Onset quality:  Gradual Timing:  Intermittent Progression:  Unchanged Chronicity:  Recurrent Relieved by:  None tried Worsened by:  Coughing, deep breathing and movement Ineffective treatments:  None tried Associated symptoms: cough   Associated symptoms: no abdominal pain, no diaphoresis, no fever, no headache, no lower extremity edema, no nausea, no shortness of breath and no vomiting   Risk factors: male sex and smoking     HPI: A 49 year old patient presents for evaluation of chest pain. Initial onset of pain was approximately 3-6 hours ago. The patient's chest pain is sharp and is not worse with exertion. The patient's chest pain is middle- or left-sided, is not well-localized, is not described as heaviness/pressure/tightness and does not radiate to the arms/jaw/neck. The patient does not complain of nausea and denies diaphoresis. The patient has smoked in the past 90 days. The patient has no history of stroke, has no history of peripheral artery disease, denies any history of treated diabetes, has no relevant family history of coronary artery disease (first degree relative at less than age 58), is not  hypertensive, has no history of hypercholesterolemia and does not have an elevated BMI (>=30).   History reviewed. No pertinent past medical history.  There are no problems to display for this patient.   Past Surgical History:  Procedure Laterality Date  . HERNIA REPAIR         History reviewed. No pertinent family history.  Social History   Tobacco Use  . Smoking status: Current Every Day Smoker    Packs/day: 1.50    Types: Cigarettes  . Smokeless tobacco: Never Used  Vaping Use  . Vaping Use: Never used  Substance Use Topics  . Alcohol use: Yes    Alcohol/week: 5.0 standard drinks    Types: 5 Cans of beer per week    Comment: daily  . Drug use: Yes    Frequency: 7.0 times per week    Types: Marijuana, Cocaine    Home Medications Prior to Admission medications   Medication Sig Start Date End Date Taking? Authorizing Provider  cephALEXin (KEFLEX) 500 MG capsule Take 1 capsule (500 mg total) by mouth 4 (four) times daily. 08/27/16   Focht, Joyce Copa, PA  ibuprofen (ADVIL) 800 MG tablet Take 1 tablet (800 mg total) by mouth every 8 (eight) hours as needed. 05/25/19   Bethann Berkshire, MD    Allergies    Patient has no known allergies.  Review of Systems   Review of Systems  Constitutional: Negative for diaphoresis and fever.  HENT: Negative for sore throat.   Eyes: Negative for visual disturbance.  Respiratory: Positive for cough. Negative for shortness of breath.   Cardiovascular: Positive for chest pain.  Gastrointestinal: Negative for abdominal pain, nausea and vomiting.  Genitourinary: Negative for dysuria.  Musculoskeletal: Negative for neck pain.  Skin: Negative for rash.  Neurological: Negative for headaches.    Physical Exam Updated Vital Signs BP (!) 155/94   Pulse 96   Temp 98.2 F (36.8 C)   Resp 10   Ht 5\' 11"  (1.803 m)   Wt 79.4 kg   SpO2 96%   BMI 24.41 kg/m   Physical Exam Vitals and nursing note reviewed.  Constitutional:       Appearance: He is well-developed.  HENT:     Head: Normocephalic and atraumatic.  Eyes:     Conjunctiva/sclera: Conjunctivae normal.  Cardiovascular:     Rate and Rhythm: Normal rate and regular rhythm.     Heart sounds: No murmur heard.   Pulmonary:     Effort: Pulmonary effort is normal. No respiratory distress.     Breath sounds: Normal breath sounds.  Chest:     Chest wall: Tenderness present.  Abdominal:     Palpations: Abdomen is soft.     Tenderness: There is no abdominal tenderness.  Musculoskeletal:        General: Normal range of motion.     Cervical back: Neck supple.     Right lower leg: No tenderness. No edema.     Left lower leg: No tenderness. No edema.  Skin:    General: Skin is warm and dry.     Capillary Refill: Capillary refill takes less than 2 seconds.  Neurological:     General: No focal deficit present.     Mental Status: He is alert.     ED Results / Procedures / Treatments   Labs (all labs ordered are listed, but only abnormal results are displayed) Labs Reviewed  BASIC METABOLIC PANEL - Abnormal; Notable for the following components:      Result Value   Potassium 3.4 (*)    Glucose, Bld 113 (*)    All other components within normal limits  CBC - Abnormal; Notable for the following components:   MCV 100.5 (*)    MCH 34.2 (*)    All other components within normal limits  D-DIMER, QUANTITATIVE (NOT AT [facility]) - Abnormal; Notable for the following components:   D-Dimer, Quant 0.97 (*)    All other components within normal limits  TROPONIN I (HIGH SENSITIVITY)  TROPONIN I (HIGH SENSITIVITY)    EKG EKG Interpretation  Date/Time:  Wednesday November 16 2020 15:28:19 EST Ventricular Rate:  92 PR Interval:    QRS Duration: 100 QT Interval:  354 QTC Calculation: 438 R Axis:   37 Text Interpretation: Sinus rhythm Probable anterior infarct, old Nonspecific T abnormalities, lateral leads No significant change since prior 6/20 Confirmed by  Meridee ScoreButler, Dylanie Quesenberry [phone]([id number]) on 11/16/2020 3:34:09 PM   Radiology CT Angio Chest PE W/Cm &/Or Wo Cm  Result Date: 11/16/2020 CLINICAL DATA:  Suspected PE, positive D-dimer in a 49 year old male. EXAM: CT ANGIOGRAPHY CHEST WITH CONTRAST TECHNIQUE: Multidetector CT imaging of the chest was performed using the standard protocol during bolus administration of intravenous contrast. Multiplanar CT image reconstructions and MIPs were obtained to evaluate the vascular anatomy. CONTRAST:  175mL OMNIPAQUE IOHEXOL 350 MG/ML SOLN COMPARISON:  Portable chest of November of 2021 FINDINGS: Cardiovascular: Heart size is normal. No pericardial effusion. Aortic caliber is normal. Minimal atherosclerotic calcification. Central pulmonary vasculature is of normal  caliber. No sign of acute pulmonary embolism. Mediastinum/Nodes: Esophagus grossly normal. Small hiatal hernia. No thoracic inlet adenopathy. No axillary adenopathy. No hilar adenopathy. No mediastinal adenopathy. Lungs/Pleura: No consolidation. No pleural effusion. Basilar atelectasis. (Image 104, series 8) 8 x 7 mm RIGHT lower lobe pulmonary nodule. (Image 105, series 8): 6 mm RIGHT lower lobe pulmonary nodule. (Image 105, series 8): 7 x 6 mm LEFT lower lobe pulmonary nodule. Small nodules in the LEFT mid chest along the fissure may represent intrapleural lymph nodes. Also with small nodule along the RIGHT major fissure also potentially a small intrapulmonary lymph node. Airways are patent. Upper Abdomen: Hepatic steatosis. Liver is incompletely imaged. Imaged portions of upper abdominal contents are otherwise unremarkable. Musculoskeletal: No acute musculoskeletal process spinal degenerative changes. Review of the MIP images confirms the above findings. IMPRESSION: 1. No sign of acute pulmonary embolism. 2. Small basilar pulmonary nodules well-circumscribed without calcification total of 3 small nodules. In the absence of any history of neoplasm would recommend a 3 month  follow-up for further assessment. If the nodules are stable at time of repeat CT, then future CT at 18-24 months (from today's scan) is considered optional for low-risk patients, but is recommended for high-risk patients. This recommendation follows the consensus statement: Guidelines for Management of Incidental Pulmonary Nodules Detected on CT Images: From the Fleischner Society 2017; Radiology 2017; 284:228-243. 3. Hepatic steatosis. 4. Small hiatal hernia. 5. Aortic atherosclerosis. Aortic Atherosclerosis (ICD10-I70.0). Electronically Signed   By: Donzetta Kohut M.D.   On: 11/16/2020 18:26   DG Chest Port 1 View  Result Date: 11/16/2020 CLINICAL DATA:  Chest pain and chest tightness beginning this afternoon. EXAM: PORTABLE CHEST 1 VIEW COMPARISON:  05/25/2019 FINDINGS: The heart size and mediastinal contours are within normal limits. Both lungs are clear. The visualized skeletal structures are unremarkable. IMPRESSION: No active disease. Electronically Signed   By: Paulina Fusi M.D.   On: 11/16/2020 15:48    Procedures Procedures (including critical care time)  Medications Ordered in ED Medications  aspirin chewable tablet 324 mg (324 mg Oral Given 11/16/20 1556)  iohexol (OMNIPAQUE) 350 MG/ML injection 100 mL (175 mLs Intravenous Contrast Given 11/16/20 1727)    ED Course  I have reviewed the triage vital signs and the nursing notes.  Pertinent labs & imaging results that were available during my care of the patient were reviewed by me and considered in my medical decision making (see chart for details).  Clinical Course as of Nov 17 958  Wed Nov 16, 2020  1549 Chest x-ray interpreted by me as no acute infiltrate or pneumothorax.   [MB]    Clinical Course User Index [MB] Terrilee Files, MD   MDM Rules/Calculators/A&P HEAR Score: 3                       This patient complains of chest and upper back pain; this involves an extensive number of treatment Options and is a  complaint that carries with it a high risk of complications and Morbidity. The differential includes ACS, pneumonia, pneumothorax, GERD vascular, PE  I ordered, reviewed and interpreted labs, which included CBC with normal white count normal hemoglobin, chemistries normal other than mildly low potassium for glucose, troponins flat D-dimer elevated I ordered medication aspirin I ordered imaging studies which included chest x-ray and CT chest angio and I independently    visualized and interpreted imaging which showed no pneumothorax, no PE.  Does have incidental pulmonary nodule I  notified the patient of Previous records obtained and reviewed in epic no prior cardiac work-ups  After the interventions stated above, I reevaluated the patient and found him to be improved.  He is asking to be discharged.  He says he is hungry and we have not given anything to eat.  Pain has resolved.  Recommended the patient follow-up with his PCP and return instructions discussed.   Final Clinical Impression(s) / ED Diagnoses Final diagnoses:  Nonspecific chest pain  Chest wall pain  Pulmonary nodule    Rx / DC Orders ED Discharge Orders    None       Terrilee Files, MD 11/17/20 1002

## 2020-11-16 NOTE — ED Triage Notes (Signed)
Pt states he has had chest pain for the last 6 hours. Pt states his chest is tight.   Pt has been drinking beer and admits to less than a 6 pack.

## 2021-02-09 ENCOUNTER — Encounter (HOSPITAL_COMMUNITY): Payer: Self-pay | Admitting: Emergency Medicine

## 2021-02-09 ENCOUNTER — Emergency Department (HOSPITAL_COMMUNITY)
Admission: EM | Admit: 2021-02-09 | Discharge: 2021-02-09 | Disposition: A | Discharge status: Home / Self Care | Payer: Self-pay | Attending: Emergency Medicine | Admitting: Emergency Medicine

## 2021-02-09 ENCOUNTER — Other Ambulatory Visit: Payer: Self-pay

## 2021-02-09 DIAGNOSIS — K6289 Other specified diseases of anus and rectum: Secondary | ICD-10-CM | POA: Insufficient documentation

## 2021-02-09 DIAGNOSIS — F17210 Nicotine dependence, cigarettes, uncomplicated: Secondary | ICD-10-CM | POA: Insufficient documentation

## 2021-02-09 DIAGNOSIS — K641 Second degree hemorrhoids: Secondary | ICD-10-CM

## 2021-02-09 MED ORDER — DOCUSATE SODIUM 100 MG PO CAPS: 100.0000 mg | ORAL_CAPSULE | Freq: Every day | ORAL | 2 refills | Status: AC | PRN

## 2021-02-09 MED ORDER — HYDROCODONE-ACETAMINOPHEN 5-325 MG PO TABS: 1.0000 | ORAL_TABLET | ORAL | 0 refills | Status: AC | PRN

## 2021-02-09 MED ORDER — HYDROCORTISONE ACETATE 25 MG RE SUPP: 25.0000 mg | Freq: Two times a day (BID) | RECTAL | 1 refills | Status: AC

## 2021-02-09 NOTE — Discharge Instructions (Addendum)
Do sitz baths 20 minutes 6 times a day>  Schedule to see the general surgeon for removal is area persist

## 2021-02-09 NOTE — ED Triage Notes (Signed)
Cyst to anus area.  Noted on Tuesday, tried OTC meds with no relief.  Rates pain 7/10.

## 2021-02-10 NOTE — ED Provider Notes (Signed)
[facility] EMERGENCY DEPARTMENT Provider Note   CSN: [id number] Arrival date & time: 02/09/21  1157     History Chief Complaint  Patient presents with  . Cyst    To anus    Derek Shields is a 50 y.o. male.  The history is provided by the patient. No language interpreter was used.  Rectal Bleeding Quality:  Unable to specify Timing:  Constant Chronicity:  New Context: hemorrhoids   Similar prior episodes: no   Relieved by:  Nothing Worsened by:  Nothing Ineffective treatments:  None tried Associated symptoms: no abdominal pain, no fever and no recent illness   Risk factors: hx of colorectal cancer   Risk factors: no steroid use        History reviewed. No pertinent past medical history.  There are no problems to display for this patient.   Past Surgical History:  Procedure Laterality Date  . HERNIA REPAIR         History reviewed. No pertinent family history.  Social History   Tobacco Use  . Smoking status: Current Every Day Smoker    Packs/day: 1.50    Types: Cigarettes  . Smokeless tobacco: Never Used  Vaping Use  . Vaping Use: Never used  Substance Use Topics  . Alcohol use: Yes    Alcohol/week: 5.0 standard drinks    Types: 5 Cans of beer per week    Comment: daily  . Drug use: Yes    Frequency: 7.0 times per week    Types: Marijuana, Cocaine    Home Medications Prior to Admission medications   Medication Sig Start Date End Date Taking? Authorizing Provider  docusate sodium (COLACE) 100 MG capsule Take 1 capsule (100 mg total) by mouth daily as needed. 02/09/21 02/09/22 Yes Elson Areas, PA-C  HYDROcodone-acetaminophen (NORCO/VICODIN) 5-325 MG tablet Take 1 tablet by mouth every 4 (four) hours as needed for moderate pain. 02/09/21 02/09/22 Yes Elson Areas, PA-C  hydrocortisone (ANUSOL-HC) 25 MG suppository Place 1 suppository (25 mg total) rectally every 12 (twelve) hours. 02/09/21 02/09/22 Yes Cheron Schaumann K, PA-C  cephALEXin (KEFLEX) 500  MG capsule Take 1 capsule (500 mg total) by mouth 4 (four) times daily. 08/27/16   Focht, Joyce Copa, PA  famotidine (PEPCID) 20 MG tablet Take 1 tablet (20 mg total) by mouth daily. 11/16/20   Terrilee Files, MD  ibuprofen (ADVIL) 800 MG tablet Take 1 tablet (800 mg total) by mouth every 8 (eight) hours as needed. 05/25/19   Bethann Berkshire, MD    Allergies    Patient has no known allergies.  Review of Systems   Review of Systems  Constitutional: Negative for fever.  Gastrointestinal: Positive for hematochezia. Negative for abdominal pain.  All other systems reviewed and are negative.   Physical Exam Updated Vital Signs BP (!) 147/80 (BP Location: Left Arm)   Pulse 81   Temp 98.3 F (36.8 C) (Oral)   Resp 18   Ht 5\' 11"  (1.803 m)   Wt 83.9 kg   SpO2 98%   BMI 25.80 kg/m   Physical Exam Vitals and nursing note reviewed.  Constitutional:      Appearance: He is well-developed and well-nourished.  HENT:     Head: Normocephalic.  Eyes:     Extraocular Movements: EOM normal.  Pulmonary:     Effort: Pulmonary effort is normal.  Abdominal:     General: There is no distension.  Genitourinary:    Comments: Large firm hemmorhoids.  Musculoskeletal:  General: Normal range of motion.     Cervical back: Normal range of motion.  Neurological:     Mental Status: He is alert and oriented to person, place, and time.  Psychiatric:        Mood and Affect: Mood and affect normal.     ED Results / Procedures / Treatments   Labs (all labs ordered are listed, but only abnormal results are displayed) Labs Reviewed - No data to display  EKG None  Radiology No results found.  Procedures Procedures   Medications Ordered in ED Medications - No data to display  ED Course  I have reviewed the triage vital signs and the nursing notes.  Pertinent labs & imaging results that were available during my care of the patient were reviewed by me and considered in my medical  decision making (see chart for details).    MDM Rules/Calculators/A&P                          MDM:  Pt counseled on stz bath, Rx for colace, hydrocodone and anusol  Pt advised to call General Surgery to schedule evaluation  Final Clinical Impression(s) / ED Diagnoses Final diagnoses:  None    Rx / DC Orders ED Discharge Orders         Ordered    hydrocortisone (ANUSOL-HC) 25 MG suppository  Every 12 hours        02/09/21 1351    HYDROcodone-acetaminophen (NORCO/VICODIN) 5-325 MG tablet  Every 4 hours PRN        02/09/21 1351    docusate sodium (COLACE) 100 MG capsule  Daily PRN        02/09/21 1353        An After Visit Summary was printed and given to the patient.    Elson Areas, [place] 02/10/21 1051    Mancel Bale, MD 02/10/21 1100

## 2021-02-14 ENCOUNTER — Encounter: Payer: Self-pay | Admitting: General Surgery

## 2021-02-14 ENCOUNTER — Other Ambulatory Visit: Payer: Self-pay

## 2021-02-14 ENCOUNTER — Ambulatory Visit (INDEPENDENT_AMBULATORY_CARE_PROVIDER_SITE_OTHER): Payer: Self-pay | Admitting: General Surgery

## 2021-02-14 VITALS — BP 166/85 | HR 71 | Temp 97.9°F | Resp 14 | Ht 70.5 in | Wt 195.0 lb

## 2021-02-14 DIAGNOSIS — K640 First degree hemorrhoids: Secondary | ICD-10-CM

## 2021-02-14 NOTE — Patient Instructions (Signed)

## 2021-02-15 NOTE — Progress Notes (Signed)
Derek Shields; [id number]; [birth year]   HPI Patient is a 50 year old white male who was referred to my care by Dr. Olena Leatherwood in the emergency room for evaluation and treatment of hemorrhoids.  Patient states he had an acute onset of swollen hemorrhoids and was seen in the emergency room on 02/09/2021.  At that time, he was noted to have grade 2 hemorrhoids and was discharged with creams.  He has been soaking in Epson salt.  He states his hemorrhoids have decreased in size.  No active bleeding is noted at this time.  He has never had hemorrhoid surgery in the past.  He does have intermittent episodes of swollen hemorrhoids. History reviewed. No pertinent past medical history.  Past Surgical History:  Procedure Laterality Date  . HERNIA REPAIR      History reviewed. No pertinent family history.  Current Outpatient Medications on File Prior to Visit  Medication Sig Dispense Refill  . docusate sodium (COLACE) 100 MG capsule Take 1 capsule (100 mg total) by mouth daily as needed. 30 capsule 2  . HYDROcodone-acetaminophen (NORCO/VICODIN) 5-325 MG tablet Take 1 tablet by mouth every 4 (four) hours as needed for moderate pain. 20 tablet 0  . hydrocortisone (ANUSOL-HC) 25 MG suppository Place 1 suppository (25 mg total) rectally every 12 (twelve) hours. 12 suppository 1   No current facility-administered medications on file prior to visit.    No Known Allergies  Social History   Substance and Sexual Activity  Alcohol Use Yes  . Alcohol/week: 5.0 standard drinks  . Types: 5 Cans of beer per week   Comment: daily    Social History   Tobacco Use  Smoking Status Current Every Day Smoker  . Packs/day: 1.50  . Types: Cigarettes  Smokeless Tobacco Never Used    Review of Systems  Constitutional: Negative.   HENT: Negative.   Eyes: Negative.   Respiratory: Negative.   Cardiovascular: Negative.   Gastrointestinal: Negative.   Genitourinary: Negative.   Musculoskeletal: Negative.   Skin:  Negative.   Neurological: Negative.   Endo/Heme/Allergies: Negative.   Psychiatric/Behavioral: Negative.     Objective   Vitals:   02/14/21 1338  BP: (!) 166/85  Pulse: 71  Resp: 14  Temp: 97.9 F (36.6 C)  SpO2: 94%    Physical Exam Vitals reviewed.  Constitutional:      Appearance: Normal appearance. He is normal weight. He is not ill-appearing.  HENT:     Head: Normocephalic and atraumatic.  Cardiovascular:     Rate and Rhythm: Normal rate and regular rhythm.     Heart sounds: Normal heart sounds. No murmur heard. No friction rub. No gallop.   Pulmonary:     Effort: Pulmonary effort is normal. No respiratory distress.     Breath sounds: Normal breath sounds. No stridor. No wheezing, rhonchi or rales.  Abdominal:     General: There is no distension.     Palpations: Abdomen is soft. There is no mass.     Tenderness: There is no abdominal tenderness. There is no guarding or rebound.     Hernia: No hernia is present.  Genitourinary:    Comments: Rectal examination reveals no significant external hemorrhoids present.  A residual internal hemorrhoid is noted at the 7 o'clock position.  No active bleeding is noted.  Mild grade 1 internal hemorrhoids are present.  Sphincter tone normal. Skin:    General: Skin is warm and dry.  Neurological:     Mental Status: He is alert and oriented  to person, place, and time.   ER notes reviewed  Assessment  Grade 1 hemorrhoidal disease.  This is significantly improved from the records from the emergency room. Plan   No need for acute surgical invention at this time.  Patient was instructed on how to treat acute hemorrhoidal swelling and to try to avoid hemorrhoid issues in the future.  Literature was given.  Follow-up as needed.

## 2022-02-09 ENCOUNTER — Encounter (HOSPITAL_COMMUNITY): Payer: Self-pay | Admitting: Emergency Medicine

## 2022-02-09 ENCOUNTER — Emergency Department (HOSPITAL_COMMUNITY)
Admission: EM | Admit: 2022-02-09 | Discharge: 2022-02-10 | Disposition: A | Discharge status: Home / Self Care | Payer: Self-pay | Attending: Emergency Medicine | Admitting: Emergency Medicine

## 2022-02-09 ENCOUNTER — Other Ambulatory Visit: Payer: Self-pay

## 2022-02-09 ENCOUNTER — Emergency Department (HOSPITAL_COMMUNITY): Payer: Self-pay

## 2022-02-09 DIAGNOSIS — R030 Elevated blood-pressure reading, without diagnosis of hypertension: Secondary | ICD-10-CM | POA: Insufficient documentation

## 2022-02-09 DIAGNOSIS — W208XXA Other cause of strike by thrown, projected or falling object, initial encounter: Secondary | ICD-10-CM | POA: Insufficient documentation

## 2022-02-09 DIAGNOSIS — M19071 Primary osteoarthritis, right ankle and foot: Secondary | ICD-10-CM | POA: Insufficient documentation

## 2022-02-09 DIAGNOSIS — S9031XA Contusion of right foot, initial encounter: Secondary | ICD-10-CM | POA: Insufficient documentation

## 2022-02-09 NOTE — ED Notes (Signed)
Pt sock removed, given an ice pack and elevated on a pillow for comfort.

## 2022-02-09 NOTE — ED Triage Notes (Signed)
Pt to the ED with complaints of right foot pain after dropping a log on it 2 days ago.

## 2022-02-10 NOTE — ED Provider Notes (Signed)
[facility] EMERGENCY DEPARTMENT Provider Note   CSN: [id number] Arrival date & time: 02/09/22  2027     History  Chief Complaint  Patient presents with   Foot Injury    Derek Shields is a 51 y.o. male.  The history is provided by the patient.  Foot Injury He has no significant past history and comes in complaining of pain in his right foot after dropping a log on it 2 days ago.  He is complaining of pain across the distal midfoot.  He denies other injury.   Home Medications Prior to Admission medications   Not on File      Allergies    Patient has no known allergies.    Review of Systems   Review of Systems  All other systems reviewed and are negative.  Physical Exam Updated Vital Signs BP (!) 160/137 (BP Location: Right Arm)    Pulse 90    Temp 98.4 F (36.9 C) (Oral)    Resp 20    Ht 5\' 10"  (1.778 m)    Wt 88.5 kg    SpO2 100%    BMI 27.99 kg/m  Physical Exam Vitals and nursing note reviewed.  51 year old male, resting comfortably and in no acute distress. Vital signs are significant for elevated blood pressure. Oxygen saturation is 100%, which is normal. Head is normocephalic and atraumatic. PERRLA, EOMI. Oropharynx is clear. Neck is nontender and supple without adenopathy or JVD. Back is nontender and there is no CVA tenderness. Lungs are clear without rales, wheezes, or rhonchi. Chest is nontender. Heart has regular rate and rhythm without murmur. Abdomen is soft, flat, nontender. Extremities: There is no swelling or deformity noted of the right foot.  There is tenderness palpation rather diffusely throughout the right midfoot with maximum tenderness in the region of the MTP joints.  Distal sensation is normal and capillary refill is prompt. Skin is warm and dry without rash. Neurologic: Mental status is normal, cranial nerves are intact, moves all extremities equally.  ED Results / Procedures / Treatments    Radiology DG Foot Complete Right  Result Date:  02/09/2022 CLINICAL DATA:  Right foot pain, crush injury 2 days ago EXAM: RIGHT FOOT COMPLETE - 3+ VIEW COMPARISON:  None. FINDINGS: Frontal, oblique, and lateral views of the right foot are obtained. No acute fracture, subluxation, or dislocation. 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 Small inferior calcaneal spur. Mild soft tissue swelling of the forefoot. IMPRESSION: 1. Soft tissue swelling.  No acute fracture. 2. 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 Electronically Signed   By: 02/11/2022 M.D.   On: 02/09/2022 21:24    Procedures Procedures    Medications Ordered in ED Medications - No data to display  ED Course/ Medical Decision Making/ A&P                           Medical Decision Making Amount and/or Complexity of Data Reviewed Radiology: ordered.   Injury to right foot.  X-rays are obtained showing no evidence of fracture.  I have independently viewed the images, and agree with the radiologist's interpretation.  Also, blood pressure is noted to be elevated.  Blood pressure was repeated and has come down to 151/95.  This is still elevated.  Old records are reviewed, and he has no relevant past visits.  Last ED visit was 1 year ago at which time blood pressure was moderately mildly elevated.  Final Clinical Impression(s) / ED Diagnoses Final diagnoses:  Contusion of right foot, initial encounter  Elevated blood pressure reading without diagnosis of hypertension    Rx / DC Orders ED Discharge Orders     None         Dione Booze, MD 02/10/22 0023

## 2022-02-10 NOTE — Discharge Instructions (Addendum)
Apply ice for 30 minutes at a time, 4 times a day.  Take either naproxen or ibuprofen as needed for pain.  If you need additional pain relief, add acetaminophen.  If you combine acetaminophen with either ibuprofen or naproxen, you get better pain relief then if you take either medication by itself.  Your blood pressure was high today.  Please have it checked several times over the next 1-2 weeks.  If it is consistently high, you will need to be treated for high blood pressure.  Untreated high blood pressure can lead to heart attacks, strokes, kidney failure.

## 2022-09-05 ENCOUNTER — Emergency Department (HOSPITAL_COMMUNITY)
Admission: EM | Admit: 2022-09-05 | Discharge: 2022-09-05 | Discharge status: Against Medical Advice | Payer: Medicaid Other | Attending: Emergency Medicine | Admitting: Emergency Medicine

## 2022-09-05 ENCOUNTER — Other Ambulatory Visit: Payer: Self-pay

## 2022-09-05 ENCOUNTER — Encounter (HOSPITAL_COMMUNITY): Payer: Self-pay | Admitting: *Deleted

## 2022-09-05 DIAGNOSIS — Z5321 Procedure and treatment not carried out due to patient leaving prior to being seen by health care provider: Secondary | ICD-10-CM | POA: Insufficient documentation

## 2022-09-05 DIAGNOSIS — M545 Low back pain, unspecified: Secondary | ICD-10-CM | POA: Insufficient documentation

## 2022-09-05 NOTE — ED Triage Notes (Signed)
Pt with c/o lower back pain.  Pt arrived with no shoes, states he walked from home here. Pt refuses to sit down on chair, states his back pain is worse.

## 2022-09-05 NOTE — ED Notes (Signed)
Pt informed registration that he is leaving.

## 2022-11-17 ENCOUNTER — Encounter (HOSPITAL_COMMUNITY)
Admission: EM | Discharge status: Against Medical Advice | Payer: Self-pay | Source: Home / Self Care | Attending: General Surgery

## 2022-11-17 ENCOUNTER — Inpatient Hospital Stay (HOSPITAL_COMMUNITY): Payer: Self-pay | Admitting: Anesthesiology

## 2022-11-17 ENCOUNTER — Encounter (HOSPITAL_COMMUNITY): Payer: Self-pay | Admitting: Anesthesiology

## 2022-11-17 ENCOUNTER — Observation Stay (HOSPITAL_COMMUNITY)
Admission: EM | Admit: 2022-11-17 | Discharge: 2022-11-17 | Discharge status: Against Medical Advice | Payer: Self-pay | Attending: General Surgery | Admitting: General Surgery

## 2022-11-17 ENCOUNTER — Emergency Department (HOSPITAL_COMMUNITY): Payer: Self-pay | Admitting: Anesthesiology

## 2022-11-17 ENCOUNTER — Emergency Department (HOSPITAL_COMMUNITY): Payer: Self-pay

## 2022-11-17 DIAGNOSIS — W44B3XA Plastic toy and toy part entering into or through a natural orifice, initial encounter: Secondary | ICD-10-CM | POA: Insufficient documentation

## 2022-11-17 DIAGNOSIS — R933 Abnormal findings on diagnostic imaging of other parts of digestive tract: Secondary | ICD-10-CM | POA: Insufficient documentation

## 2022-11-17 DIAGNOSIS — T185XXA Foreign body in anus and rectum, initial encounter: Principal | ICD-10-CM

## 2022-11-17 DIAGNOSIS — K626 Ulcer of anus and rectum: Secondary | ICD-10-CM | POA: Insufficient documentation

## 2022-11-17 DIAGNOSIS — F17210 Nicotine dependence, cigarettes, uncomplicated: Secondary | ICD-10-CM | POA: Insufficient documentation

## 2022-11-17 HISTORY — PX: LAPAROTOMY: SHX154

## 2022-11-17 HISTORY — PX: FLEXIBLE SIGMOIDOSCOPY: SHX5431

## 2022-11-17 LAB — CBC WITH DIFFERENTIAL/PLATELET
Abs Immature Granulocytes: 0.06 10*3/uL (ref 0.00–0.07)
Basophils Absolute: 0.1 10*3/uL (ref 0.0–0.1)
Basophils Relative: 1 %
Eosinophils Absolute: 0.3 10*3/uL (ref 0.0–0.5)
Eosinophils Relative: 3 %
HCT: 46.5 % (ref 39.0–52.0)
Hemoglobin: 15.9 g/dL (ref 13.0–17.0)
Immature Granulocytes: 1 %
Lymphocytes Relative: 17 %
Lymphs Abs: 2.1 10*3/uL (ref 0.7–4.0)
MCH: 34.9 pg — ABNORMAL HIGH (ref 26.0–34.0)
MCHC: 34.2 g/dL (ref 30.0–36.0)
MCV: 102.2 fL — ABNORMAL HIGH (ref 80.0–100.0)
Monocytes Absolute: 1.1 10*3/uL — ABNORMAL HIGH (ref 0.1–1.0)
Monocytes Relative: 9 %
Neutro Abs: 9.2 10*3/uL — ABNORMAL HIGH (ref 1.7–7.7)
Neutrophils Relative %: 69 %
Platelets: 285 10*3/uL (ref 150–400)
RBC: 4.55 MIL/uL (ref 4.22–5.81)
RDW: 12.8 % (ref 11.5–15.5)
WBC: 12.9 10*3/uL — ABNORMAL HIGH (ref 4.0–10.5)
nRBC: 0 % (ref 0.0–0.2)

## 2022-11-17 LAB — COMPREHENSIVE METABOLIC PANEL
ALT: 20 U/L (ref 0–44)
AST: 26 U/L (ref 15–41)
Albumin: 3.8 g/dL (ref 3.5–5.0)
Alkaline Phosphatase: 36 U/L — ABNORMAL LOW (ref 38–126)
Anion gap: 9 (ref 5–15)
BUN: 8 mg/dL (ref 6–20)
CO2: 21 mmol/L — ABNORMAL LOW (ref 22–32)
Calcium: 9.2 mg/dL (ref 8.9–10.3)
Chloride: 109 mmol/L (ref 98–111)
Creatinine, Ser: 0.67 mg/dL (ref 0.61–1.24)
GFR, Estimated: >60 mL/min (ref >60)
Glucose, Bld: 78 mg/dL (ref 70–99)
Potassium: 4 mmol/L (ref 3.5–5.1)
Sodium: 139 mmol/L (ref 135–145)
Total Bilirubin: 0.9 mg/dL (ref 0.3–1.2)
Total Protein: 6.7 g/dL (ref 6.5–8.1)

## 2022-11-17 LAB — POC OCCULT BLOOD, ED: Fecal Occult Bld: POSITIVE — AB

## 2022-11-17 SURGERY — LAPAROTOMY, EXPLORATORY
Anesthesia: General

## 2022-11-17 SURGERY — SIGMOIDOSCOPY, FLEXIBLE
Anesthesia: Monitor Anesthesia Care

## 2022-11-17 MED ORDER — TETANUS-DIPHTH-ACELL PERTUSSIS 5-2.5-18.5 LF-MCG/0.5 IM SUSY: 0.5000 mL | PREFILLED_SYRINGE | Freq: Once | INTRAMUSCULAR | Status: DC

## 2022-11-17 MED ORDER — THIAMINE MONONITRATE 100 MG PO TABS: 100.0000 mg | ORAL_TABLET | Freq: Every day | ORAL | Status: DC

## 2022-11-17 MED ORDER — LACTATED RINGERS IV SOLN: INTRAVENOUS | Status: DC | PRN

## 2022-11-17 MED ORDER — FENTANYL CITRATE (PF) 100 MCG/2ML IJ SOLN
INTRAMUSCULAR | Status: AC
  Filled 2022-11-17: qty 2

## 2022-11-17 MED ORDER — PANTOPRAZOLE SODIUM 40 MG IV SOLR: 40.0000 mg | Freq: Every day | INTRAVENOUS | Status: DC

## 2022-11-17 MED ORDER — LORAZEPAM 1 MG PO TABS: 1.0000 mg | ORAL_TABLET | ORAL | Status: DC | PRN

## 2022-11-17 MED ORDER — FENTANYL CITRATE (PF) 100 MCG/2ML IJ SOLN: 25.0000 ug | INTRAMUSCULAR | Status: DC | PRN

## 2022-11-17 MED ORDER — ACETAMINOPHEN 500 MG PO TABS: 1000.0000 mg | ORAL_TABLET | Freq: Four times a day (QID) | ORAL | Status: DC

## 2022-11-17 MED ORDER — SODIUM CHLORIDE 0.9 % IV SOLN: INTRAVENOUS | Status: DC

## 2022-11-17 MED ORDER — LORAZEPAM 2 MG/ML IJ SOLN: 1.0000 mg | INTRAMUSCULAR | Status: DC | PRN

## 2022-11-17 MED ORDER — CHLORHEXIDINE GLUCONATE CLOTH 2 % EX PADS: 6.0000 | MEDICATED_PAD | Freq: Once | CUTANEOUS | Status: DC

## 2022-11-17 MED ORDER — LABETALOL HCL 5 MG/ML IV SOLN
INTRAVENOUS | Status: DC | PRN
  Administered 2022-11-17: 5 mg via INTRAVENOUS

## 2022-11-17 MED ORDER — FOLIC ACID 1 MG PO TABS: 1.0000 mg | ORAL_TABLET | Freq: Every day | ORAL | Status: DC

## 2022-11-17 MED ORDER — THIAMINE HCL 100 MG/ML IJ SOLN: 100.0000 mg | Freq: Every day | INTRAMUSCULAR | Status: DC

## 2022-11-17 MED ORDER — SUCCINYLCHOLINE CHLORIDE 200 MG/10ML IV SOSY
PREFILLED_SYRINGE | INTRAVENOUS | Status: AC
  Filled 2022-11-17: qty 10

## 2022-11-17 MED ORDER — SODIUM CHLORIDE 0.9 % IR SOLN
Status: DC | PRN
  Administered 2022-11-17: 1000 mL

## 2022-11-17 MED ORDER — ADULT MULTIVITAMIN W/MINERALS CH: 1.0000 | ORAL_TABLET | Freq: Every day | ORAL | Status: DC

## 2022-11-17 MED ORDER — SUCCINYLCHOLINE CHLORIDE 200 MG/10ML IV SOSY
PREFILLED_SYRINGE | INTRAVENOUS | Status: DC | PRN
  Administered 2022-11-17: 100 mg via INTRAVENOUS

## 2022-11-17 MED ORDER — ONDANSETRON HCL 4 MG/2ML IJ SOLN: 4.0000 mg | Freq: Once | INTRAMUSCULAR | Status: DC | PRN

## 2022-11-17 MED ORDER — OXYCODONE HCL 5 MG PO TABS: 5.0000 mg | ORAL_TABLET | ORAL | Status: DC | PRN

## 2022-11-17 MED ORDER — ONDANSETRON 4 MG PO TBDP: 4.0000 mg | ORAL_TABLET | Freq: Four times a day (QID) | ORAL | Status: DC | PRN

## 2022-11-17 MED ORDER — MIDAZOLAM HCL 5 MG/5ML IJ SOLN
INTRAMUSCULAR | Status: DC | PRN
  Administered 2022-11-17: 2 mg via INTRAVENOUS

## 2022-11-17 MED ORDER — DIPHENHYDRAMINE HCL 12.5 MG/5ML PO ELIX: 12.5000 mg | ORAL_SOLUTION | Freq: Four times a day (QID) | ORAL | Status: DC | PRN

## 2022-11-17 MED ORDER — MIDAZOLAM HCL 2 MG/2ML IJ SOLN
INTRAMUSCULAR | Status: AC
  Filled 2022-11-17: qty 2

## 2022-11-17 MED ORDER — METOPROLOL TARTRATE 5 MG/5ML IV SOLN: 5.0000 mg | Freq: Four times a day (QID) | INTRAVENOUS | Status: DC | PRN

## 2022-11-17 MED ORDER — DOCUSATE SODIUM 100 MG PO CAPS: 100.0000 mg | ORAL_CAPSULE | Freq: Two times a day (BID) | ORAL | Status: DC

## 2022-11-17 MED ORDER — SODIUM CHLORIDE 0.9 % IV SOLN
2.0000 g | INTRAVENOUS | Status: DC
  Filled 2022-11-17: qty 2

## 2022-11-17 MED ORDER — FENTANYL CITRATE (PF) 100 MCG/2ML IJ SOLN
INTRAMUSCULAR | Status: DC | PRN
  Administered 2022-11-17: 100 ug via INTRAVENOUS

## 2022-11-17 MED ORDER — ONDANSETRON HCL 4 MG/2ML IJ SOLN: 4.0000 mg | Freq: Four times a day (QID) | INTRAMUSCULAR | Status: DC | PRN

## 2022-11-17 MED ORDER — DIPHENHYDRAMINE HCL 50 MG/ML IJ SOLN: 12.5000 mg | Freq: Four times a day (QID) | INTRAMUSCULAR | Status: DC | PRN

## 2022-11-17 MED ORDER — PROPOFOL 10 MG/ML IV BOLUS
INTRAVENOUS | Status: DC | PRN
  Administered 2022-11-17: 200 mg via INTRAVENOUS

## 2022-11-17 MED ORDER — PROPOFOL 10 MG/ML IV BOLUS
INTRAVENOUS | Status: AC
  Filled 2022-11-17: qty 20

## 2022-11-17 SURGICAL SUPPLY — 59 items
APPLIER CLIP 11 MED OPEN (CLIP)
APPLIER CLIP 13 LRG OPEN (CLIP)
BARRIER SKIN 2 3/4 (OSTOMY) IMPLANT
BARRIER SKIN OD2.25 2 3/4 FLNG (OSTOMY) IMPLANT
CHLORAPREP W/TINT 26 (MISCELLANEOUS) ×1 IMPLANT
CLAMP POUCH DRAINAGE QUIET (OSTOMY) IMPLANT
CLIP APPLIE 11 MED OPEN (CLIP) IMPLANT
CLIP APPLIE 13 LRG OPEN (CLIP) IMPLANT
CLOTH BEACON ORANGE TIMEOUT ST (SAFETY) ×1 IMPLANT
COVER LIGHT HANDLE STERIS (MISCELLANEOUS) ×2 IMPLANT
DRAPE WARM FLUID 44X44 (DRAPES) ×1 IMPLANT
DRSG OPSITE POSTOP 4X10 (GAUZE/BANDAGES/DRESSINGS) IMPLANT
DRSG OPSITE POSTOP 4X8 (GAUZE/BANDAGES/DRESSINGS) IMPLANT
ELECT BLADE 6 FLAT ULTRCLN (ELECTRODE) IMPLANT
ELECT REM PT RETURN 9FT ADLT (ELECTROSURGICAL)
ELECTRODE REM PT RTRN 9FT ADLT (ELECTROSURGICAL) ×1 IMPLANT
GLOVE BIO SURGEON STRL SZ 6.5 (GLOVE) ×1 IMPLANT
GLOVE BIOGEL PI IND STRL 6.5 (GLOVE) ×1 IMPLANT
GLOVE BIOGEL PI IND STRL 7.0 (GLOVE) ×2 IMPLANT
GOWN STRL REUS W/TWL LRG LVL3 (GOWN DISPOSABLE) ×3 IMPLANT
HANDLE SUCTION POOLE (INSTRUMENTS) ×1 IMPLANT
INST SET MAJOR GENERAL (KITS) ×1 IMPLANT
KIT REMOVER STAPLE SKIN (MISCELLANEOUS) IMPLANT
KIT TURNOVER KIT A (KITS) ×1 IMPLANT
LIGASURE IMPACT 36 18CM CVD LR (INSTRUMENTS) IMPLANT
MANIFOLD NEPTUNE II (INSTRUMENTS) ×1 IMPLANT
NDL HYPO 18GX1.5 BLUNT FILL (NEEDLE) ×1 IMPLANT
NDL HYPO 21X1.5 SAFETY (NEEDLE) ×1 IMPLANT
NEEDLE HYPO 18GX1.5 BLUNT FILL (NEEDLE) IMPLANT
NEEDLE HYPO 21X1.5 SAFETY (NEEDLE) IMPLANT
NS IRRIG 1000ML POUR BTL (IV SOLUTION) ×2 IMPLANT
PACK MAJOR ABDOMINAL (CUSTOM PROCEDURE TRAY) ×1 IMPLANT
PAD ARMBOARD 7.5X6 YLW CONV (MISCELLANEOUS) ×1 IMPLANT
PENCIL SMOKE EVACUATOR COATED (MISCELLANEOUS) ×1 IMPLANT
POUCH OSTOMY 2 3/4  H 3804 (WOUND CARE)
POUCH OSTOMY 2 PC DRNBL 2.75 (WOUND CARE) IMPLANT
RELOAD LINEAR CUT PROX 55 BLUE (ENDOMECHANICALS) IMPLANT
RELOAD PROXIMATE 75MM BLUE (ENDOMECHANICALS) IMPLANT
RELOAD STAPLE 55 3.8 BLU REG (ENDOMECHANICALS) IMPLANT
RELOAD STAPLE 75 3.8 BLU REG (ENDOMECHANICALS) IMPLANT
RETRACTOR WND ALEXIS-O 25 LRG (MISCELLANEOUS) IMPLANT
RETRACTOR WOUND ALXS 18CM MED (MISCELLANEOUS) IMPLANT
RTRCTR WOUND ALEXIS O 18CM MED (MISCELLANEOUS)
RTRCTR WOUND ALEXIS O 25CM LRG (MISCELLANEOUS)
SET BASIN LINEN APH (SET/KITS/TRAYS/PACK) ×1 IMPLANT
SPONGE T-LAP 18X18 ~~LOC~~+RFID (SPONGE) ×1 IMPLANT
STAPLER GUN LINEAR PROX 60 (STAPLE) IMPLANT
STAPLER PROXIMATE 55 BLUE (STAPLE) IMPLANT
STAPLER PROXIMATE 75MM BLUE (STAPLE) IMPLANT
STAPLER VISISTAT (STAPLE) ×1 IMPLANT
SUCTION POOLE HANDLE (INSTRUMENTS)
SUT CHROMIC 0 SH (SUTURE) IMPLANT
SUT CHROMIC 2 0 SH (SUTURE) IMPLANT
SUT CHROMIC 3 0 SH 27 (SUTURE) IMPLANT
SUT PDS AB CT VIOLET #0 27IN (SUTURE) ×2 IMPLANT
SUT PROLENE 2 0 SH 30 (SUTURE) IMPLANT
SUT SILK 3 0 SH CR/8 (SUTURE) ×1 IMPLANT
SYR 20ML LL LF (SYRINGE) ×2 IMPLANT
TRAY FOLEY MTR SLVR 16FR STAT (SET/KITS/TRAYS/PACK) ×1 IMPLANT

## 2022-11-17 NOTE — Consult Note (Signed)
Maylon Peppers, M.D. Gastroenterology & Hepatology                                           Patient Name: Derek Shields Account #: _0 @   MRN: [id number] Admission Date: 11/17/2022 Date of Evaluation:  11/17/2022 Time of Evaluation: 1:45 PM   Referring Physician: Godfrey Pick, MD  Chief Complaint: Foreign body in rectum.  HPI:  This is a 51 y.o. male with history of alcohol abuse, who comes to the hospital after presenting persistent rectal discomfort after inserting a foreign body in his rectum.  Patient reports that 2 days ago he inserted a toothbrush cover in his rectum and he was not able to remove it.  He has not presented any abdominal pain or discomfort but he feels persistent rectal pressure.  Denies any nausea, vomiting, fever, chills, abdominal distention, lightheadedness or dizziness.  States that he has presented some intermittent scant fresh blood in his stool.  Last alcohol drink was 30 minutes ago.  In the ED, he was HD stable and afebrile. Labs were remarkable for leukocytosis of 12,900 with a hemoglobin of 15.9, platelets 285. Positive alcohol in blood.  Pelvic x-ray shows a 3 x 20 cm tubular foreign body in the central pelvis.  Past Medical History: SEE CHRONIC ISSSUES:No past medical history on file. Past Surgical History:  Past Surgical History:  Procedure Laterality Date   HERNIA REPAIR     Family History: No family history on file. Social History:  Social History   Tobacco Use   Smoking status: Every Day    Packs/day: 1.50    Types: Cigarettes   Smokeless tobacco: Never  Vaping Use   Vaping Use: Never used  Substance Use Topics   Alcohol use: Yes    Alcohol/week: 84.0 standard drinks of alcohol    Types: 84 Cans of beer per week    Comment: daily   Drug use: Yes    Frequency: 7.0 times per week    Types: Marijuana, Cocaine    Comment: Coke use is 2-3 times a month    Home Medications:  Prior to Admission medications   Not on File     Inpatient Medications: No current facility-administered medications for this encounter. No current outpatient medications on file. Allergies: Patient has no known allergies.  Complete Review of Systems: GENERAL: negative for malaise, night sweats HEENT: No changes in hearing or vision, no nose bleeds or other nasal problems. NECK: Negative for lumps, goiter, pain and significant neck swelling RESPIRATORY: Negative for cough, wheezing CARDIOVASCULAR: Negative for chest pain, leg swelling, palpitations, orthopnea GI: SEE HPI MUSCULOSKELETAL: Negative for joint pain or swelling, back pain, and muscle pain. SKIN: Negative for lesions, rash PSYCH: Negative for sleep disturbance, mood disorder and recent psychosocial stressors. HEMATOLOGY Negative for prolonged bleeding, bruising easily, and swollen nodes. ENDOCRINE: Negative for cold or heat intolerance, polyuria, polydipsia and goiter. NEURO: negative for tremor, gait imbalance, syncope and seizures. The remainder of the review of systems is noncontributory.  Physical Exam: BP (!) 150/96 (BP Location: Left Arm)   Pulse 77   Temp 98.9 F (37.2 C) (Oral)   Resp 14   SpO2 98%  GENERAL: The patient is AO x3, in no acute distress. HEENT: Head is normocephalic and atraumatic. EOMI are intact. Mouth is well hydrated and without lesions. NECK: Supple. No masses LUNGS: Clear to auscultation. No  presence of rhonchi/wheezing/rales. Adequate chest expansion HEART: RRR, normal s1 and s2. ABDOMEN: Soft, nontender, no guarding, no peritoneal signs, and nondistended. BS +. No masses. EXTREMITIES: Without any cyanosis, clubbing, rash, lesions or edema. NEUROLOGIC: AOx3, no focal motor deficit. SKIN: no jaundice, no rashes  Laboratory Data CBC:     Component Value Date/Time   WBC 7.6 11/16/2020 1541   RBC 4.42 11/16/2020 1541   HGB 15.1 11/16/2020 1541   HCT 44.4 11/16/2020 1541   PLT 285 11/16/2020 1541   MCV 100.5 (H) 11/16/2020 1541    MCH 34.2 (H) 11/16/2020 1541   MCHC 34.0 11/16/2020 1541   RDW 12.8 11/16/2020 1541   LYMPHSABS 2.8 05/25/2019 0200   MONOABS 1.0 05/25/2019 0200   EOSABS 0.4 05/25/2019 0200   BASOSABS 0.1 05/25/2019 0200   COAG: No results found for: "INR", "PROTIME"  BMP:     Latest Ref Rng & Units 11/16/2020    3:41 PM 05/25/2019    2:00 AM  BMP  Glucose 70 - 99 mg/dL 113  84   BUN 6 - 20 mg/dL 7  10   Creatinine 0.61 - 1.24 mg/dL 0.99  0.72   Sodium 135 - 145 mmol/L 138  143   Potassium 3.5 - 5.1 mmol/L 3.4  4.0   Chloride 98 - 111 mmol/L 107  110   CO2 22 - 32 mmol/L 22  22   Calcium 8.9 - 10.3 mg/dL 9.0  9.0     HEPATIC:     Latest Ref Rng & Units 05/25/2019    2:00 AM  Hepatic Function  Total Protein 6.5 - 8.1 g/dL 7.4   Albumin 3.5 - 5.0 g/dL 3.8   AST 15 - 41 U/L 32   ALT 0 - 44 U/L 25   Alk Phosphatase 38 - 126 U/L 39   Total Bilirubin 0.3 - 1.2 mg/dL 0.4   Bilirubin, Direct 0.0 - 0.2 mg/dL 0.1     CARDIAC:  Lab Results  Component Value Date   TROPONINI <0.03 05/25/2019     Imaging: I personally reviewed and interpreted the available imaging.  Assessment & Plan: Shaquon Gropp is a 51 y.o. male with history of alcohol abuse, who comes to the hospital after presenting persistent rectal discomfort after inserting a foreign body in his rectum.  I discussed with the patient the need to proceed with endoscopic removal of this foreign body with a flexible sigmoidoscopy.  I also explained to him that there is a risk of perforation and bleeding when proceeding with this but the foreign body needs to be removed.  If unsuccessful, will need to proceed with exploratory laparotomy -this was discussed with on-call general surgeon Dr. Constance Haw who agreed.  Maylon Peppers, MD Gastroenterology and Hepatology [facility] Gastroenterology

## 2022-11-17 NOTE — Progress Notes (Signed)
[facility]  Able to remove foreign body via the rectum after intubation and muscle relaxer. Will give diet, monitor and get labs in the AM. No major signs of any injury on the flexible sigmoidoscopy, small ulcer noted on photos.   CIWA protocol Patient did not want me to notify any family.  Hopefully home in next 24-48 hours.   Algis Greenhouse, MD [facility] [address] [place], [place] [address] [phone] (office)

## 2022-11-17 NOTE — Anesthesia Preprocedure Evaluation (Signed)
Anesthesia Evaluation  Patient identified by MRN, date of birth, ID band Patient awake    Reviewed: Allergy & Precautions, H&P , NPO status , Patient's Chart, lab work & pertinent test results, reviewed documented beta blocker date and time   Airway Mallampati: II  TM Distance: >3 FB Neck ROM: full    Dental no notable dental hx. (+) Missing, Loose,    Pulmonary neg pulmonary ROS, Current Smoker   Pulmonary exam normal breath sounds clear to auscultation       Cardiovascular Exercise Tolerance: Good negative cardio ROS  Rhythm:regular Rate:Normal     Neuro/Psych negative neurological ROS  negative psych ROS   GI/Hepatic negative GI ROS,,,(+)     substance abuse  alcohol use  Endo/Other  negative endocrine ROS    Renal/GU negative Renal ROS  negative genitourinary   Musculoskeletal   Abdominal   Peds  Hematology negative hematology ROS (+)   Anesthesia Other Findings   Reproductive/Obstetrics negative OB ROS                             Anesthesia Physical Anesthesia Plan  ASA: 4 and emergent  Anesthesia Plan: MAC   Post-op Pain Management:    Induction:   PONV Risk Score and Plan:   Airway Management Planned:   Additional Equipment:   Intra-op Plan:   Post-operative Plan:   Informed Consent: I have reviewed the patients History and Physical, chart, labs and discussed the procedure including the risks, benefits and alternatives for the proposed anesthesia with the patient or authorized representative who has indicated his/her understanding and acceptance.     Dental Advisory Given and Dental advisory given  Plan Discussed with: CRNA  Anesthesia Plan Comments: (Patient intoxicated. Foreign body in rectum. Will attempts with MAC.  If not, will have to intubate and prepare for surgical intervention. )       Anesthesia Quick Evaluation

## 2022-11-17 NOTE — Progress Notes (Signed)
[facility]  Called by supervising nurse, patient has left by sneaking out of stairway, leaving AMA without letting anyone discuss anything with him. I had talked to him about the reasons for monitoring overnight, given the risk of potential erosion through the colon and rectum.   Asked supervising nurse to just check to make sure he was not trying to drive.  Staff and security tried to find him. We  did everything we could, but he left.  Algis Greenhouse, MD

## 2022-11-17 NOTE — Progress Notes (Signed)
[place] and I responded to door alarms and noticed this patient was not in his room. We immediately checked stairwell and called security and AC. Did not see evidence of IV removal in room, pt belongings were gone.   Dr. Henreitta Leber made aware.   Called patients phone number and was able to reach him, when asked if IV was still in he stated "no I threw it in a big trash can outside" I advised patient that he is not to drive for 24 hours. Patient states he's walking.

## 2022-11-17 NOTE — Progress Notes (Signed)
Patient came to floor at 1724 from PACU. Patients nurse went into room to start admission questions nurse left room to grab patient some medicine. At 1750 the stairwell door alarm was going off at nurses station stating  west stairwell door was open.upon going down hall to the door  patient was not found in room or on floor. Security called, [facility] called, MD called and aware of patient leaving the floor.

## 2022-11-17 NOTE — Transfer of Care (Signed)
Immediate Anesthesia Transfer of Care Note  Patient: Derek Shields  Procedure(s) Performed: FLEXIBLE SIGMOIDOSCOPY  Patient Location: PACU  Anesthesia Type:MAC  Level of Consciousness: awake and alert   Airway & Oxygen Therapy: Patient Spontanous Breathing  Post-op Assessment: Report given to RN and Post -op Vital signs reviewed and stable  Post vital signs: Reviewed and stable  Last Vitals:  Vitals Value Taken Time  BP 153/102 11/17/22 1500  Temp 36.9 C 11/17/22 1500  Pulse 76 11/17/22 1503  Resp 15 11/17/22 1503  SpO2 98 % 11/17/22 1503  Vitals shown include unvalidated device data.  Last Pain:  Vitals:   11/17/22 1432  TempSrc:   PainSc: 0-No pain         Complications: No notable events documented.

## 2022-11-17 NOTE — Anesthesia Postprocedure Evaluation (Signed)
Anesthesia Post Note  Patient: Derek Shields  Procedure(s) Performed: EXPLORATORY LAPAROTOMY, rectal exam, possible colectomy, possible colostomy removal foreign body  Patient location during evaluation: PACU Anesthesia Type: General Level of consciousness: awake and alert Pain management: pain level controlled Vital Signs Assessment: post-procedure vital signs reviewed and stable Respiratory status: spontaneous breathing, nonlabored ventilation, respiratory function stable and patient connected to nasal cannula oxygen Cardiovascular status: blood pressure returned to baseline and stable Postop Assessment: no apparent nausea or vomiting Anesthetic complications: no   No notable events documented.   Last Vitals:  Vitals:   11/17/22 1545 11/17/22 1600  BP: (!) 156/96 (!) 153/96  Pulse: 64 64  Resp: 14 13  Temp:    SpO2: 95% 94%    Last Pain:  Vitals:   11/17/22 1600  TempSrc:   PainSc: 0-No pain                 Windell Norfolk

## 2022-11-17 NOTE — Brief Op Note (Signed)
11/17/2022  3:05 PM  PATIENT:  Derek Shields  51 y.o. male  PRE-OPERATIVE DIAGNOSIS:  foreign body in rectum  POST-OPERATIVE DIAGNOSIS:  foreign body in rectum- attempted removal -flexible sigmoidoscopy  PROCEDURE:  Procedure(s): FLEXIBLE SIGMOIDOSCOPY (N/A)  SURGEON:  Surgeon(s) and Role:    * Dolores Frame, MD - Primary  Patient underwent flex sigmoidoscopy under midazolam light sedation.  Tolerated the procedure adequately.  Findings - Foreign body in the recto-sigmoid colon.  Removal was unsuccessful.  - Mucosal ulceration in the rectum.   RECOMMENDATIONS - Admit the patient to hospital ward for ongoing care.  - NPO.  - Proceed with exploratory laparotomy for foreign body removal.  Katrinka Blazing, MD Gastroenterology and Hepatology [facility]

## 2022-11-17 NOTE — Consult Note (Signed)
[facility] Consult  Reason for Consult: Rectal foreign body  Referring Physician: Dr. Levon Hedger    Chief Complaint   Foreign Body in Rectum     HPI: Derek Shields is a 51 y.o. male with a history of alcohol who comes in with rectal discomfort and has a foreign body in his rectum that was unable to be retrieved with flexible sigmoidoscopy. It is reported to be a toothbrush holder. This was inserted 2 days prior.  No reported abdominal pain but he has constant rectal pressure. He has had blood in his stool.   History reviewed. No pertinent past medical history.  Past Surgical History:  Procedure Laterality Date   HERNIA REPAIR      History reviewed. No pertinent family history.  Social History   Tobacco Use   Smoking status: Every Day    Packs/day: 1.50    Types: Cigarettes   Smokeless tobacco: Never  Vaping Use   Vaping Use: Never used  Substance Use Topics   Alcohol use: Yes    Alcohol/week: 84.0 standard drinks of alcohol    Types: 84 Cans of beer per week    Comment: daily   Drug use: Yes    Frequency: 7.0 times per week    Types: Marijuana, Cocaine    Comment: Coke use is 2-3 times a month    Medications: I have reviewed the patient's current medications. Current Facility-Administered Medications  Medication Dose Route Frequency Provider Last Rate Last Admin   0.9 %  sodium chloride infusion   Intravenous Continuous Marguerita Merles, Reuel Boom, MD       Melene Muller ON 11/18/2022] cefoTEtan (CEFOTAN) 2 g in sodium chloride 0.9 % 100 mL IVPB  2 g Intravenous On Call to OR Lucretia Roers, MD       Chlorhexidine Gluconate Cloth 2 % PADS 6 each  6 each Topical Once Lucretia Roers, MD       fentaNYL (SUBLIMAZE) injection 25-50 mcg  25-50 mcg Intravenous Q5 min PRN Windell Norfolk, MD       ondansetron [facility]) injection 4 mg  4 mg Intravenous Once PRN Windell Norfolk, MD        Scheduled Meds:  Chlorhexidine Gluconate Cloth  6 each Topical Once    Continuous Infusions:  sodium chloride     [START ON 11/18/2022] cefoTEtan (CEFOTAN) IV     PRN Meds:.fentaNYL (SUBLIMAZE) injection, ondansetron (ZOFRAN) IV   No Known Allergies   ROS:  A comprehensive review of systems was negative except for: Constitutional: positive for alcohol use Gastrointestinal: positive for rectal pressure and bleeding   Blood pressure (!) 153/102, pulse 74, temperature 98.4 F (36.9 C), resp. rate 14, SpO2 98 %. Physical Exam Vitals reviewed.  HENT:     Head: Normocephalic.     Nose: Nose normal.  Eyes:     Extraocular Movements: Extraocular movements intact.  Cardiovascular:     Rate and Rhythm: Normal rate.  Pulmonary:     Effort: Pulmonary effort is normal.  Abdominal:     General: There is no distension.     Palpations: Abdomen is soft.     Tenderness: There is no abdominal tenderness.  Musculoskeletal:        General: No swelling.     Cervical back: Normal range of motion.  Skin:    General: Skin is warm.  Neurological:     General: No focal deficit present.     Mental Status: He is alert.  Psychiatric:  Mood and Affect: Mood normal.        Behavior: Behavior normal.     Results: Results for orders placed or performed during the hospital encounter of 11/17/22 (from the past 48 hour(s))  Comprehensive metabolic panel     Status: Abnormal   Collection Time: 11/17/22  1:34 PM  Result Value Ref Range   Sodium 139 135 - 145 mmol/L   Potassium 4.0 3.5 - 5.1 mmol/L   Chloride 109 98 - 111 mmol/L   CO2 21 (L) 22 - 32 mmol/L   Glucose, Bld 78 70 - 99 mg/dL    Comment: Glucose reference range applies only to samples taken after fasting for at least 8 hours.   BUN 8 6 - 20 mg/dL   Creatinine, Ser 3.61 0.61 - 1.24 mg/dL   Calcium 9.2 8.9 - 44.3 mg/dL   Total Protein 6.7 6.5 - 8.1 g/dL   Albumin 3.8 3.5 - 5.0 g/dL   AST 26 15 - 41 U/L   ALT 20 0 - 44 U/L   Alkaline Phosphatase 36 (L) 38 - 126 U/L   Total Bilirubin 0.9 0.3 -  1.2 mg/dL   GFR, Estimated >15 >40 mL/min    Comment: (NOTE) Calculated using the CKD-EPI Creatinine Equation (2021)    Anion gap 9 5 - 15    Comment: Performed at [facility], [address], [place], [place] [address]  CBC with Differential     Status: Abnormal   Collection Time: 11/17/22  1:34 PM  Result Value Ref Range   WBC 12.9 (H) 4.0 - 10.5 K/uL   RBC 4.55 4.22 - 5.81 MIL/uL   Hemoglobin 15.9 13.0 - 17.0 g/dL   HCT 19.5 09.3 - 26.7 %   MCV 102.2 (H) 80.0 - 100.0 fL   MCH 34.9 (H) 26.0 - 34.0 pg   MCHC 34.2 30.0 - 36.0 g/dL   RDW 12.4 58.0 - 99.8 %   Platelets 285 150 - 400 K/uL   nRBC 0.0 0.0 - 0.2 %   Neutrophils Relative % 69 %   Neutro Abs 9.2 (H) 1.7 - 7.7 K/uL   Lymphocytes Relative 17 %   Lymphs Abs 2.1 0.7 - 4.0 K/uL   Monocytes Relative 9 %   Monocytes Absolute 1.1 (H) 0.1 - 1.0 K/uL   Eosinophils Relative 3 %   Eosinophils Absolute 0.3 0.0 - 0.5 K/uL   Basophils Relative 1 %   Basophils Absolute 0.1 0.0 - 0.1 K/uL   Immature Granulocytes 1 %   Abs Immature Granulocytes 0.06 0.00 - 0.07 K/uL    Comment: Performed at [facility], [address], [place], [place] [address]  POC occult blood, ED Provider will collect (collected)     Status: Abnormal   Collection Time: 11/17/22  1:45 PM  Result Value Ref Range   Fecal Occult Bld POSITIVE (A) NEGATIVE    DG Pelvis 1-2 Views  Result Date: 11/17/2022 CLINICAL DATA:  Foreign body in rectum. EXAM: PELVIS - 1-2 VIEW COMPARISON:  None Available. FINDINGS: A 3 x 20 cm tubular lucency overlying the central pelvis likely represents the patient's reported foreign body. No other radiopaque foreign bodies are identified. The bowel gas pattern is unremarkable. No bony abnormalities are noted. IMPRESSION: 3 x 20 cm tubular lucency overlying the central pelvis likely represents the patient's reported foreign body. Electronically Signed   By: Harmon Pier M.D.   On: 11/17/2022 13:29     Assessment & Plan:  Derek Shields is  a 51  y.o. male with a rectal foreign body that was unable to be removed with flexibile sigmoidoscopy. He will need exploration. I will try to milk it down or up to remove it. He may need a colectomy versus a colostomy. Discussed risk of bleeding, infection, colostomy if there is any injury or perforation or need for large colectomy. He will be admitted to the hospitalist service given the alcohol abuse and risk of withdrawal.   He has no one he wants me to call after. His mother is his emergency contact.   All questions were answered to the satisfaction of the patient.    Lucretia Roers 11/17/2022, 3:10 PM

## 2022-11-17 NOTE — Op Note (Signed)
[facility] Patient Name: Derek Shields Procedure Date: 11/17/2022 2:31 PM MRN: [id number] Date of Birth: Apr 30, 1971 Attending MD: Katrinka Blazing , , [id number] CSN: [id number] Age: 51 Admit Type: Outpatient Procedure:                Flexible Sigmoidoscopy Indications:              Abnormal abdominal x-ray of the GI tract, Foreign                            body in rectum Providers:                Katrinka Blazing, Buel Ream. Thomasena Edis RN, RN, Cyril Mourning, Technician Referring MD:              Medicines:                Monitored Anesthesia Care Complications:            No immediate complications. Estimated Blood Loss:     Estimated blood loss: none. Procedure:                Pre-Anesthesia Assessment:                           - Prior to the procedure, a History and Physical                            was performed, and patient medications, allergies                            and sensitivities were reviewed. The patient's                            tolerance of previous anesthesia was reviewed.                           - The risks and benefits of the procedure and the                            sedation options and risks were discussed with the                            patient. All questions were answered and informed                            consent was obtained.                           After obtaining informed consent, the scope was                            passed under direct vision. The PCF-HQ190L                            ([id number]) scope was introduced  through the anus and                            advanced to the the sigmoid colon. The patient                            tolerated the procedure well. The flexible                            sigmoidoscopy was performed with difficulty due to                            foreign body location. Scope In: 2:35:25 PM Scope Out: 2:54:39 PM Total Procedure Duration: 0 hours 19 minutes  14 seconds  Findings:      A foreign body was found in the recto-sigmoid colon. Removal of a green       toothbrush cover was not accomplished with a jumbo snare after multiple       atttempts. Removal was not achieved as the foreign body was long and in       an horizontal disposition.      Nonbleeding ulcerated mucosa with stigmata of recent bleeding were       present in the rectum. Impression:               - Foreign body in the recto-sigmoid colon. Removal                            was unsuccessful.                           - Mucosal ulceration in the rectum. Moderate Sedation:      Per Anesthesia Care Recommendation:           - Admit the patient to hospital ward for ongoing                            care.                           - NPO.                           - Proceed with exploratory laparotomy for foreign                            body removal. Procedure Code(s):        --- Professional ---                           [phone], 51, Sigmoidoscopy, flexible; with removal of                            foreign body(s) Diagnosis Code(s):        --- Professional ---                           E08.1KGY, Foreign body in anus and rectum, initial  encounter                           K62.6, Ulcer of anus and rectum                           R93.3, Abnormal findings on diagnostic imaging of                            other parts of digestive tract CPT copyright 2022 American [employer] Association. All rights reserved. The codes documented in this report are preliminary and upon coder review may  be revised to meet current compliance requirements. Katrinka Blazing, MD Katrinka Blazing,  11/17/2022 3:06:47 PM This report has been signed electronically. Number of Addenda: 0

## 2022-11-17 NOTE — ED Provider Notes (Cosign Needed Addendum)
[facility] EMERGENCY DEPARTMENT Provider Note   CSN: [id number] Arrival date & time: 11/17/22  1233     History  Chief Complaint  Patient presents with   Foreign Body in Rectum    Derek Shields is a 51 y.o. male.   Foreign Body in Rectum Pertinent negatives include no chest pain, no abdominal pain and no shortness of breath.       Derek Shields is a 51 y.o. male with hx of frequent alcohol use, who presents to the Emergency Department requesting evaluation of foreign body in the rectum.  He states that he used a plastic travel toothbrush holder as a sex toy and inserted it into his rectum 2 days ago.  He has tried to remove it himself without success.  He has noticed urge to defecate with small amount of loose stool and has noticed some blood when wiping his rectum.  Urinating with out difficulty.  He denies any abdominal pain, back pain, fever or chills.  No nausea or vomiting.    Home Medications Prior to Admission medications   Not on File      Allergies    Patient has no known allergies.    Review of Systems   Review of Systems  Constitutional:  Negative for chills and fever.  Respiratory:  Negative for shortness of breath.   Cardiovascular:  Negative for chest pain.  Gastrointestinal:  Positive for anal bleeding and rectal pain. Negative for abdominal distention, abdominal pain, nausea and vomiting.  Genitourinary:  Negative for dysuria and flank pain.  Musculoskeletal:  Negative for back pain.  Skin:  Negative for rash.  Neurological:  Negative for dizziness, weakness and numbness.    Physical Exam Updated Vital Signs BP (!) 150/96 (BP Location: Left Arm)   Pulse 77   Temp 98.9 F (37.2 C) (Oral)   Resp 14   SpO2 98%  Physical Exam Vitals and nursing note reviewed. Exam conducted with a chaperone present.  Constitutional:      General: He is not in acute distress.    Appearance: Normal appearance. He is not toxic-appearing.  Cardiovascular:     Rate  and Rhythm: Normal rate and regular rhythm.     Pulses: Normal pulses.  Pulmonary:     Effort: Pulmonary effort is normal.     Breath sounds: Normal breath sounds.  Abdominal:     General: There is no distension.     Palpations: Abdomen is soft. There is no mass.     Tenderness: There is no abdominal tenderness.  Genitourinary:    Rectum: Guaiac result positive. No tenderness, anal fissure or external hemorrhoid. Normal anal tone.     Comments: Brown, heme positive stool on DRE.  On digital rectal exam, there is palpable foreign body deep within the rectum.   Musculoskeletal:        General: Normal range of motion.  Skin:    General: Skin is warm.     Capillary Refill: Capillary refill takes less than 2 seconds.  Neurological:     General: No focal deficit present.     Mental Status: He is alert.     Sensory: No sensory deficit.     Motor: No weakness.     ED Results / Procedures / Treatments   Labs (all labs ordered are listed, but only abnormal results are displayed) Labs Reviewed  COMPREHENSIVE METABOLIC PANEL - Abnormal; Notable for the following components:      Result Value   CO2 21 (*)  Alkaline Phosphatase 36 (*)    All other components within normal limits  CBC WITH DIFFERENTIAL/PLATELET - Abnormal; Notable for the following components:   WBC 12.9 (*)    MCV 102.2 (*)    MCH 34.9 (*)    Neutro Abs 9.2 (*)    Monocytes Absolute 1.1 (*)    All other components within normal limits  POC OCCULT BLOOD, ED - Abnormal; Notable for the following components:   Fecal Occult Bld POSITIVE (*)    All other components within normal limits    EKG None  Radiology DG Pelvis 1-2 Views  Result Date: 11/17/2022 CLINICAL DATA:  Foreign body in rectum. EXAM: PELVIS - 1-2 VIEW COMPARISON:  None Available. FINDINGS: A 3 x 20 cm tubular lucency overlying the central pelvis likely represents the patient's reported foreign body. No other radiopaque foreign bodies are identified.  The bowel gas pattern is unremarkable. No bony abnormalities are noted. IMPRESSION: 3 x 20 cm tubular lucency overlying the central pelvis likely represents the patient's reported foreign body. Electronically Signed   By: Harmon Pier M.D.   On: 11/17/2022 13:29    Procedures Procedures    Medications Ordered in ED Medications - No data to display  ED Course/ Medical Decision Making/ A&P                           Medical Decision Making Patient here for evaluation of foreign body of the rectum.  He inserted a plastic toothbrush holder into his rectum 2 days ago.  He has unsuccessfully attempted to remove this himself.  He denies any abdominal pain nausea vomiting or fever chills.  On exam, digital rectal exam performed by me, I can palpate the distal end of the foreign body.  Abdomen is soft without guarding or rebound tenderness.  Low clinical suspicion for perforation at this time. Patient will likely need referral to surgery versus GI for removal of foreign body.  Amount and/or Complexity of Data Reviewed Labs: ordered.    Details:  Labs interpreted by me, mild leukocytosis with white count of 12,900 hemoglobin unremarkable.  Chemistries without significant derangement.  Fecal Hemoccult positive. Radiology: ordered.    Details: 1 view pelvis shows 3 x 20 cm tubular lucency in the central pelvis Discussion of management or test interpretation with external provider(s): Spoke with GI, Dr. Levon Hedger,  who will eval pt in ED and will likely take patient to endoscopy for foreign body removal  Addendum: I was contacted by Dr. Levon Hedger who took patient to endoscopy for attempted foreign body removal, unsuccessful attempt.  Requested that general surgery be contacted.  Patient will require hospital admission.  Spoke with general surgery, Dr. Henreitta Leber who request that patient be hospital admit due to his history of alcohol use.  Dr. Henreitta Leber had successful removal of FB after sedation and manual  manipulation.  She will admit to her service.    Risk Decision regarding hospitalization.     Final Clinical Impression(s) / ED Diagnoses Final diagnoses:  Foreign body in anus and rectum, initial encounter    Rx / DC Orders ED Discharge Orders     None         Pauline Aus, PA-C 11/17/22 1455    Pauline Aus, PA-C 11/17/22 2221    Gloris Manchester, MD 11/18/22 0630

## 2022-11-17 NOTE — Op Note (Signed)
[facility] Operative Note  11/17/22  Preoperative Diagnosis: Rectal foreign body   Postoperative Diagnosis: Same   Procedure(s) Performed: Rectal exam, removal of foreign body   Surgeon: Leatrice Jewels. Henreitta Leber, MD   Assistants: No qualified resident was available    Anesthesia: General endotracheal   Anesthesiologist: Dr. Johnnette Litter, MD    Specimens: None    Estimated Blood Loss: Minimal   Blood Replacement: None    Complications: None   Operative Indications: Mr. Gloss is a 51 yo who has a rectal foreign body. He had an attempt at a flexible sigmoidoscopy that was non sedated due to him having drank alcohol today and this was unsuccessful. I was called in to attempt removal. We discussed me attempt rectal removal first versus exploration and possible colectomy, colostomy and risk of bleeding, infection, finding a perforation.   Findings: Rectal exam with foreign body at finger tips, placed pressure on pelvis and removed foreign body without issues, minor stool on toothbrush holder, no blood    Procedure: The patient was taken to the operating room and placed supine. General endotracheal anesthesia was induced. Intravenous antibiotics were administered per protocol.  He was placed in lithotomy. With lubricated fingers, I did a rectal exam and felt the toothbrush holder. I then placed three fingers in his rectum and placed pressure on his pelvis with my other hand and was able to easily removed the foreign body. There was some minor stool on the toothbrush holder but no blood.  was prepared and draped in the usual sterile fashion.   All counts were correct at the end of the case. The patient was awakened from anesthesia and extubated without complication.  The patient went to the PACU in stable condition.   He will be monitored and started on a diet to ensure he does well after this removal.   Algis Greenhouse, MD [facility] [address]  [place], [place] [address] [phone] (office)

## 2022-11-17 NOTE — Anesthesia Procedure Notes (Signed)
Procedure Name: Intubation Date/Time: 11/17/2022 4:27 PM  Performed by: [name], MDPre-anesthesia Checklist: Patient identified, Patient being monitored, Timeout performed, Emergency Drugs available and Suction available Patient Re-evaluated:Patient Re-evaluated prior to induction Oxygen Delivery Method: Circle System Utilized Preoxygenation: Pre-oxygenation with 100% oxygen Induction Type: IV induction, Rapid sequence and Cricoid Pressure applied Ventilation: Mask ventilation without difficulty Laryngoscope Size: Mac and 3 Grade View: Grade I Tube type: Oral Tube size: 7.0 mm Number of attempts: 1 Airway Equipment and Method: Stylet Placement Confirmation: ETT inserted through vocal cords under direct vision, positive ETCO2 and breath sounds checked- equal and bilateral Secured at: 21 cm Tube secured with: Tape Dental Injury: Teeth and Oropharynx as per pre-operative assessment

## 2022-11-17 NOTE — Anesthesia Postprocedure Evaluation (Signed)
Anesthesia Post Note  Patient: Derek Shields  Procedure(s) Performed: [place]  Patient location during evaluation: PACU Anesthesia Type: MAC Level of consciousness: awake and alert Pain management: pain level controlled Vital Signs Assessment: post-procedure vital signs reviewed and stable Respiratory status: spontaneous breathing, nonlabored ventilation, respiratory function stable and patient connected to nasal cannula oxygen Cardiovascular status: stable and blood pressure returned to baseline Postop Assessment: no apparent nausea or vomiting Anesthetic complications: no   No notable events documented.   Last Vitals:  Vitals:   11/17/22 1400 11/17/22 1500  BP: (!) 166/106 (!) 153/102  Pulse: 77 74  Resp:    Temp:  36.9 C  SpO2: 97% 98%    Last Pain:  Vitals:   11/17/22 1432  TempSrc:   PainSc: 0-No pain                 [name]

## 2022-11-17 NOTE — ED Triage Notes (Signed)
Pt states that he has "homemade dildo from a two sided toothbrush holder" in his rectum for two days. Pt states he has had some blood when wiping and also when trying to have a BM. No pain at this time.

## 2022-11-17 NOTE — Anesthesia Preprocedure Evaluation (Signed)
Anesthesia Evaluation  Patient identified by MRN, date of birth, ID band Patient awake    Reviewed: Allergy & Precautions, H&P , NPO status , Patient's Chart, lab work & pertinent test results, reviewed documented beta blocker date and time   Airway Mallampati: II  TM Distance: >3 FB Neck ROM: full    Dental no notable dental hx. (+) Missing, Loose,    Pulmonary neg pulmonary ROS, Current Smoker   Pulmonary exam normal breath sounds clear to auscultation       Cardiovascular Exercise Tolerance: Good negative cardio ROS  Rhythm:regular Rate:Normal     Neuro/Psych negative neurological ROS  negative psych ROS   GI/Hepatic negative GI ROS,,,(+)     substance abuse  alcohol use  Endo/Other  negative endocrine ROS    Renal/GU negative Renal ROS  negative genitourinary   Musculoskeletal   Abdominal   Peds  Hematology negative hematology ROS (+)   Anesthesia Other Findings   Reproductive/Obstetrics negative OB ROS                              Anesthesia Physical Anesthesia Plan  ASA: 4 and emergent  Anesthesia Plan: General   Post-op Pain Management:    Induction: Rapid sequence and Cricoid pressure planned  PONV Risk Score and Plan:   Airway Management Planned: Oral ETT  Additional Equipment:   Intra-op Plan:   Post-operative Plan:   Informed Consent: I have reviewed the patients History and Physical, chart, labs and discussed the procedure including the risks, benefits and alternatives for the proposed anesthesia with the patient or authorized representative who has indicated his/her understanding and acceptance.     Dental Advisory Given and Dental advisory given  Plan Discussed with: CRNA  Anesthesia Plan Comments: ( )        Anesthesia Quick Evaluation

## 2022-11-17 NOTE — Progress Notes (Signed)
[facility]  Flex sig unable to get rectal foreign body. Reviewed imaging. Will plan for ex lap and will attempt to milk up or down versus colectomy possible colostomy.   Algis Greenhouse, MD [facility] [address] [place], [place] [address] [phone] (office)

## 2022-11-17 NOTE — Transfer of Care (Signed)
Immediate Anesthesia Transfer of Care Note  Patient: Derek Shields  Procedure(s) Performed: EXPLORATORY LAPAROTOMY, rectal exam, possible colectomy, possible colostomy removal foreign body  Patient Location: PACU  Anesthesia Type:General  Level of Consciousness: awake and alert   Airway & Oxygen Therapy: Patient Spontanous Breathing  Post-op Assessment: Report given to RN and Post -op Vital signs reviewed and stable  Post vital signs: Reviewed and stable  Last Vitals:  Vitals Value Taken Time  BP 138/93   Temp 98.2   Pulse 91 11/17/22 1642  Resp 19 11/17/22 1642  SpO2 98 % 11/17/22 1642  Vitals shown include unvalidated device data.  Last Pain:  Vitals:   11/17/22 1600  TempSrc:   PainSc: 0-No pain         Complications: No notable events documented.

## 2022-11-18 LAB — HIV ANTIBODY (ROUTINE TESTING W REFLEX): HIV Screen 4th Generation wRfx: NONREACTIVE

## 2022-11-21 NOTE — Discharge Summary (Signed)
Physician Discharge Summary  Patient ID: Derek Shields MRN: [id number] DOB/AGE: July 08, 1971 51 y.o.  Admit date: 11/17/2022 Discharge date: 11/21/2022  Admission Diagnoses: Rectal foreign body  Discharge Diagnoses:  Principal Problem:   Rectal foreign body Active Problems:   Foreign body in anus and rectum   Discharged Condition: Unknown   Hospital Course: Left AMA after general anesthesia and rectal foreign body removal.   Consults:  GI - flex sig    Discharge Exam: Blood pressure (!) 143/88, pulse 77, temperature 98.3 F (36.8 C), resp. rate 20, SpO2 98 %.   Disposition:    Allergies as of 11/17/2022   No Known Allergies      Medication List    You have not been prescribed any medications.      Signed: Lucretia Roers 11/21/2022, 9:00 AM

## 2022-11-22 ENCOUNTER — Encounter (HOSPITAL_COMMUNITY): Payer: Self-pay | Admitting: General Surgery

## 2023-01-30 ENCOUNTER — Encounter (HOSPITAL_COMMUNITY): Payer: Self-pay | Admitting: General Surgery

## 2023-04-12 ENCOUNTER — Other Ambulatory Visit: Payer: Self-pay | Admitting: *Deleted

## 2023-04-12 DIAGNOSIS — K429 Umbilical hernia without obstruction or gangrene: Secondary | ICD-10-CM

## 2023-04-18 ENCOUNTER — Ambulatory Visit: Payer: Medicaid Other | Admitting: General Surgery

## 2023-04-18 ENCOUNTER — Encounter: Payer: Self-pay | Admitting: General Surgery

## 2023-04-18 ENCOUNTER — Ambulatory Visit (HOSPITAL_COMMUNITY)
Admission: RE | Admit: 2023-04-18 | Discharge: 2023-04-18 | Disposition: A | Discharge status: Home / Self Care | Payer: Medicaid Other | Source: Ambulatory Visit | Attending: General Surgery | Admitting: General Surgery

## 2023-04-18 VITALS — BP 130/84 | HR 113 | Temp 98.5°F | Resp 16 | Ht 70.5 in | Wt 184.0 lb

## 2023-04-18 DIAGNOSIS — K429 Umbilical hernia without obstruction or gangrene: Secondary | ICD-10-CM

## 2023-04-18 MED ORDER — IOHEXOL 300 MG/ML  SOLN
100.0000 mL | Freq: Once | INTRAMUSCULAR | Status: AC | PRN
  Administered 2023-04-18: 100 mL via INTRAVENOUS

## 2023-04-18 NOTE — Patient Instructions (Signed)
Will get a CT to confirm the hernia and then will call with results and plan.

## 2023-04-19 NOTE — Progress Notes (Signed)
[facility] History and Physical  Reason for Referral:Recurrent umbilical hernia  Referring Physician: Toma Deiters, MD   Chief Complaint   Hernia     Derek Shields is a 52 y.o. male.  HPI: Derek Shields is a 52 yo who has a history of alcohol use and some substance abuse in the past. He is active and works in [employer]. He says that he had an umbilical hernia repair in the past with Dr. Gabriel Cirri and that he used mesh. The patient reports having pain in the right side of this repair and a bulge. He has been doing light duty but this is preventing him from doing his work in [employer]. He is getting ready to get into busy season and wants to proceed with any repair.  History reviewed. No pertinent past medical history.  Past Surgical History:  Procedure Laterality Date   FLEXIBLE SIGMOIDOSCOPY N/A 11/17/2022   Procedure: FLEXIBLE SIGMOIDOSCOPY;  Surgeon: Dolores Frame, MD;  Location: [facility];  Service: Gastroenterology;  Laterality: N/A;   HERNIA REPAIR     LAPAROTOMY N/A 11/17/2022   Procedure: rectal exam under anesthesia ,;  Surgeon: Lucretia Roers, MD;  Location: [facility];  Service: General;  Laterality: N/A;    History reviewed. No pertinent family history.  Social History   Tobacco Use   Smoking status: Every Day    Packs/day: 1.5    Types: Cigarettes   Smokeless tobacco: Never  Vaping Use   Vaping Use: Never used  Substance Use Topics   Alcohol use: Yes    Alcohol/week: 84.0 standard drinks of alcohol    Types: 84 Cans of beer per week    Comment: daily   Drug use: Yes    Frequency: 7.0 times per week    Types: Marijuana, Cocaine    Comment: Coke use is 2-3 times a month    Medications: I have reviewed the patient's current medications. Allergies as of 04/18/2023   No Known Allergies      Medication List    as of April 18, 2023 11:59 PM   You have not been prescribed any medications.      ROS:  A  comprehensive review of systems was negative except for: Respiratory: positive for cough Gastrointestinal: positive for abdominal pain Genitourinary: positive for frequency and hesitancy Musculoskeletal: positive for back pain, neck pain, and joint pain Neurological: positive for dizziness and numbness  Blood pressure 130/84, pulse (!) 113, temperature 98.5 F (36.9 C), temperature source Oral, resp. rate 16, height 5' 10.5" (1.791 m), weight 184 lb (83.5 kg), SpO2 96 %. Physical Exam Vitals reviewed.  HENT:     Head: Normocephalic.     Nose: Nose normal.  Eyes:     Extraocular Movements: Extraocular movements intact.  Cardiovascular:     Rate and Rhythm: Normal rate and regular rhythm.  Pulmonary:     Effort: Pulmonary effort is normal.     Breath sounds: Normal breath sounds.  Abdominal:     General: There is no distension.     Palpations: Abdomen is soft.     Tenderness: There is abdominal tenderness.     Comments: Right of umbilicus  tenderness, some induration, difficult to say if hernia defect   Neurological:     Mental Status: He is alert.     Results: CT ordered today and results below- reviewed the imaging with radiology and reviewed myself- area of pain coincides with 5mm defect right of midline  CT Abdomen Pelvis W Contrast  Result Date: 04/18/2023 CLINICAL DATA:  Abdominal wall hernia suspected. EXAM: CT ABDOMEN AND PELVIS WITH CONTRAST TECHNIQUE: Multidetector CT imaging of the abdomen and pelvis was performed using the standard protocol following bolus administration of intravenous contrast. RADIATION DOSE REDUCTION: This exam was performed according to the departmental dose-optimization program which includes automated exposure control, adjustment of the mA and/or kV according to patient size and/or use of iterative reconstruction technique. CONTRAST:  OMNIPAQUE IOHEXOL 300 MG/ML  SOLN COMPARISON:  None Available. FINDINGS: Lower chest: No acute abnormality.  Hepatobiliary: Hepatic steatosis.  Normal gallbladder. Pancreas: Unremarkable. No pancreatic ductal dilatation or surrounding inflammatory changes. Spleen: Normal in size without focal abnormality. Adrenals/Urinary Tract: Adrenal glands are unremarkable. Kidneys are normal, without renal calculi, focal lesion, or hydronephrosis. Bladder is unremarkable. Stomach/Bowel: Stomach is within normal limits. Appendix appears normal. No evidence of bowel wall thickening, distention, or inflammatory changes. Vascular/Lymphatic: Aortic atherosclerosis. No enlarged abdominal or pelvic lymph nodes. Reproductive: Enlarged prostate gland. Other: No abdominal wall hernia or abnormality. No abdominopelvic ascites. Prior inguinal hernia repair. Musculoskeletal: No acute or significant osseous findings. IMPRESSION: 1. Hepatic steatosis. 2. No evidence of acute abdominal or pelvic process. 3. No evidence of abdominal wall hernia. 4. Enlarged prostate gland. 5. Aortic atherosclerosis. Aortic Atherosclerosis (ICD10-I70.0). Electronically Signed   By: Ted Mcalpine M.D.   On: 04/18/2023 16:47     Assessment & Plan:  Derek Shields is a 52 y.o. male with a 5mm umbilical hernia that is very small. It is difficult to say if this hernia is causing his symptoms but there is a defect with some fat. He describes pain right in that area. Discussed an open repair given the small size as I do not want to do a laparoscopic procedure where 3 -8mm ports are placed to repair a 5mm hole.  I discussed open repair with primary versus mesh use and discussed risk of bleeding, infection, recurrence and possible mesh.   Discussed 5/6 as the first option. Discussed that he need to not do any cocaine or marijuana between now and then and to control his alcohol use. This would be an outpatient procedure. He should expect about 2 weeks of being at home and another 4-6 of not lifting over 10 lbs.   All questions were answered to the satisfaction of  the patient.    Lucretia Roers 04/19/2023, 2:46 PM

## 2023-04-19 NOTE — Progress Notes (Signed)
Called patient to update. CT on initial read without any obvious hernia. I reviewed imaging and reviewed with radiology. It does look like there may be a small 0.91mm defect just right of midline. I think we should do an open repair to with possible mesh. He wants to proceed with surgery on 5/6. The office will call him to get this schedule.

## 2023-04-23 DIAGNOSIS — K429 Umbilical hernia without obstruction or gangrene: Secondary | ICD-10-CM | POA: Insufficient documentation

## 2023-04-23 NOTE — Patient Instructions (Signed)
Derek Shields  04/23/2023     @PREFPERIOPPHARMACY @   Your procedure is scheduled on  04/29/2023.   Report to [facility] at  0730  A.M.   Call this number if you have problems the morning of surgery:  [phone]  If you experience any cold or flu symptoms such as cough, fever, chills, shortness of breath, etc. between now and your scheduled surgery, please notify us at the above number.   Remember:  Do not eat or drink after midnight.      Take these medicines the morning of surgery with A SIP OF WATER                                             None.    Do not wear jewelry, make-up or nail polish.  Do not wear lotions, powders, or perfumes, or deodorant.  Do not shave 48 hours prior to surgery.  Men may shave face and neck.  Do not bring valuables to the hospital.  [facility] is not responsible for any belongings or valuables.  Contacts, dentures or bridgework may not be worn into surgery.  Leave your suitcase in the car.  After surgery it may be brought to your room.  For patients admitted to the hospital, discharge time will be determined by your treatment team.  Patients discharged the day of surgery will not be allowed to drive home and must have someone with them for 24 hours.    Special instructions:   DO NOT smoke tobacco or vape for 24 hours before your procedure.  Please read over the following fact sheets that you were given. Coughing and Deep Breathing, Surgical Site Infection Prevention, Anesthesia Post-op Instructions, and Care and Recovery After Surgery       Open Hernia Repair, Adult, Care After What can I expect after the procedure? After the procedure, it is common to have: Mild discomfort. Slight bruising. Mild swelling. Pain in the belly (abdomen). A small amount of blood from the cut from surgery (incision). Follow these instructions at home: Your doctor may give you more specific instructions. If you have problems, call your  doctor. Medicines Take over-the-counter and prescription medicines only as told by your doctor. If told, take steps to prevent problems with pooping (constipation). You may need to: Drink enough fluid to keep your pee (urine) pale yellow. Take medicines. You will be told what medicines to take. Eat foods that are high in fiber. These include beans, whole grains, and fresh fruits and vegetables. Limit foods that are high in fat and sugar. These include fried or sweet foods. Ask your doctor if you should avoid driving or using machines while you are taking your medicine. Incision care  Follow instructions from your doctor about how to take care of your incision. Make sure you: Wash your hands with soap and water for at least 20 seconds before and after you change your bandage (dressing). If you cannot use soap and water, use hand sanitizer. Change your bandage. Leave stitches or skin glue in place for at least 2 weeks. Leave tape strips alone unless you are told to take them off. You may trim the edges of the tape strips if they curl up. Check your incision every day for signs of infection. Check for: More redness, swelling, or pain. More fluid or  blood. Warmth. Pus or a bad smell. Wear loose, soft clothing while your incision heals. Activity  Rest as told by your doctor. Do not lift anything that is heavier than 10 lb (4.5 kg), or the limit that you are told. Do not play contact sports until your doctor says that this is safe. If you were given a sedative during your procedure, do not drive or use machines until your doctor says that it is safe. A sedative is a medicine that helps you relax. Return to your normal activities when your doctor says that it is safe. General instructions Do not take baths, swim, or use a hot tub. Ask your doctor about taking showers or sponge baths. Hold a pillow over your belly when you cough or sneeze. This helps with pain. Do not smoke or use any  products that contain nicotine or tobacco. If you need help quitting, ask your doctor. Keep all follow-up visits. Contact a doctor if: You have any of these signs of infection in or around your incision: More redness, swelling, or pain. More fluid or blood. Warmth. Pus. A bad smell. You have a fever or chills. You have blood in your poop (stool). You have not pooped (had a bowel movement) in 2-3 days. Medicine does not help your pain. Get help right away if: You have chest pain, or you are short of breath. You feel faint or light-headed. You have very bad pain. You vomit and your pain is worse. You have pain, swelling, or redness in a leg. These symptoms may be an emergency. Get help right away. Call your local emergency services (911 in the [place] Do not wait to see if the symptoms will go away. Do not drive yourself to the hospital. Summary After this procedure, it is common to have mild discomfort, slight bruising, and mild swelling. Follow instructions from your doctor about how to take care of your cut from surgery (incision). Check every day for signs of infection. Do not lift heavy objects or play contact sports until your doctor says it is safe. Return to your normal activities as told by your doctor. This information is not intended to replace advice given to you by your health care provider. Make sure you discuss any questions you have with your health care provider. Document Revised: 07/25/2020 Document Reviewed: 07/25/2020 Elsevier Patient Education  2023 [employer] General Anesthesia, Adult, Care After The following information offers guidance on how to care for yourself after your procedure. Your health care provider may also give you more specific instructions. If you have problems or questions, contact your health care provider. What can I expect after the procedure? After the procedure, it is common for people to: Have pain or discomfort at the IV site. Have  nausea or vomiting. Have a sore throat or hoarseness. Have trouble concentrating. Feel cold or chills. Feel weak, sleepy, or tired (fatigue). Have soreness and body aches. These can affect parts of the body that were not involved in surgery. Follow these instructions at home: For the time period you were told by your health care provider:  Rest. Do not participate in activities where you could fall or become injured. Do not drive or use machinery. Do not drink alcohol. Do not take sleeping pills or medicines that cause drowsiness. Do not make important decisions or sign legal documents. Do not take care of children on your own. General instructions Drink enough fluid to keep your urine pale yellow. If you have sleep apnea, surgery  and certain medicines can increase your risk for breathing problems. Follow instructions from your health care provider about wearing your sleep device: Anytime you are sleeping, including during daytime naps. While taking prescription pain medicines, sleeping medicines, or medicines that make you drowsy. Return to your normal activities as told by your health care provider. Ask your health care provider what activities are safe for you. Take over-the-counter and prescription medicines only as told by your health care provider. Do not use any products that contain nicotine or tobacco. These products include cigarettes, chewing tobacco, and vaping devices, such as e-cigarettes. These can delay incision healing after surgery. If you need help quitting, ask your health care provider. Contact a health care provider if: You have nausea or vomiting that does not get better with medicine. You vomit every time you eat or drink. You have pain that does not get better with medicine. You cannot urinate or have bloody urine. You develop a skin rash. You have a fever. Get help right away if: You have trouble breathing. You have chest pain. You vomit blood. These  symptoms may be an emergency. Get help right away. Call 911. Do not wait to see if the symptoms will go away. Do not drive yourself to the hospital. Summary After the procedure, it is common to have a sore throat, hoarseness, nausea, vomiting, or to feel weak, sleepy, or fatigue. For the time period you were told by your health care provider, do not drive or use machinery. Get help right away if you have difficulty breathing, have chest pain, or vomit blood. These symptoms may be an emergency. This information is not intended to replace advice given to you by your health care provider. Make sure you discuss any questions you have with your health care provider. Document Revised: 03/09/2022 Document Reviewed: 03/09/2022 Elsevier Patient Education  2023 Else[employer] Inc. How to Use Chlorhexidine Before Surgery Chlorhexidine gluconate (CHG) is a germ-killing (antiseptic) solution that is used to clean the skin. It can get rid of the bacteria that normally live on the skin and can keep them away for about 24 hours. To clean your skin with CHG, you may be given: A CHG solution to use in the shower or as part of a sponge bath. A prepackaged cloth that contains CHG. Cleaning your skin with CHG may help lower the risk for infection: While you are staying in the intensive care unit of the hospital. If you have a vascular access, such as a central line, to provide short-term or long-term access to your veins. If you have a catheter to drain urine from your bladder. If you are on a ventilator. A ventilator is a machine that helps you breathe by moving air in and out of your lungs. After surgery. What are the risks? Risks of using CHG include: A skin reaction. Hearing loss, if CHG gets in your ears and you have a perforated eardrum. Eye injury, if CHG gets in your eyes and is not rinsed out. The CHG product catching fire. Make sure that you avoid smoking and flames after applying CHG to your skin. Do not  use CHG: If you have a chlorhexidine allergy or have previously reacted to chlorhexidine. On babies younger than 35 months of age. How to use CHG solution Use CHG only as told by your health care provider, and follow the instructions on the label. Use the full amount of CHG as directed. Usually, this is one bottle. During a shower Follow these steps when  using CHG solution during a shower (unless your health care provider gives you different instructions): Start the shower. Use your normal soap and shampoo to wash your face and hair. Turn off the shower or move out of the shower stream. Pour the CHG onto a clean washcloth. Do not use any type of brush or rough-edged sponge. Starting at your neck, lather your body down to your toes. Make sure you follow these instructions: If you will be having surgery, pay special attention to the part of your body where you will be having surgery. Scrub this area for at least 1 minute. Do not use CHG on your head or face. If the solution gets into your ears or eyes, rinse them well with water. Avoid your genital area. Avoid any areas of skin that have broken skin, cuts, or scrapes. Scrub your back and under your arms. Make sure to wash skin folds. Let the lather sit on your skin for 1-2 minutes or as long as told by your health care provider. Thoroughly rinse your entire body in the shower. Make sure that all body creases and crevices are rinsed well. Dry off with a clean towel. Do not put any substances on your body afterward--such as powder, lotion, or perfume--unless you are told to do so by your health care provider. Only use lotions that are recommended by the manufacturer. Put on clean clothes or pajamas. If it is the night before your surgery, sleep in clean sheets.  During a sponge bath Follow these steps when using CHG solution during a sponge bath (unless your health care provider gives you different instructions): Use your normal soap and shampoo  to wash your face and hair. Pour the CHG onto a clean washcloth. Starting at your neck, lather your body down to your toes. Make sure you follow these instructions: If you will be having surgery, pay special attention to the part of your body where you will be having surgery. Scrub this area for at least 1 minute. Do not use CHG on your head or face. If the solution gets into your ears or eyes, rinse them well with water. Avoid your genital area. Avoid any areas of skin that have broken skin, cuts, or scrapes. Scrub your back and under your arms. Make sure to wash skin folds. Let the lather sit on your skin for 1-2 minutes or as long as told by your health care provider. Using a different clean, wet washcloth, thoroughly rinse your entire body. Make sure that all body creases and crevices are rinsed well. Dry off with a clean towel. Do not put any substances on your body afterward--such as powder, lotion, or perfume--unless you are told to do so by your health care provider. Only use lotions that are recommended by the manufacturer. Put on clean clothes or pajamas. If it is the night before your surgery, sleep in clean sheets. How to use CHG prepackaged cloths Only use CHG cloths as told by your health care provider, and follow the instructions on the label. Use the CHG cloth on clean, dry skin. Do not use the CHG cloth on your head or face unless your health care provider tells you to. When washing with the CHG cloth: Avoid your genital area. Avoid any areas of skin that have broken skin, cuts, or scrapes. Before surgery Follow these steps when using a CHG cloth to clean before surgery (unless your health care provider gives you different instructions): Using the CHG cloth, vigorously scrub the part  of your body where you will be having surgery. Scrub using a back-and-forth motion for 3 minutes. The area on your body should be completely wet with CHG when you are done scrubbing. Do not rinse.  Discard the cloth and let the area air-dry. Do not put any substances on the area afterward, such as powder, lotion, or perfume. Put on clean clothes or pajamas. If it is the night before your surgery, sleep in clean sheets.  For general bathing Follow these steps when using CHG cloths for general bathing (unless your health care provider gives you different instructions). Use a separate CHG cloth for each area of your body. Make sure you wash between any folds of skin and between your fingers and toes. Wash your body in the following order, switching to a new cloth after each step: The front of your neck, shoulders, and chest. Both of your arms, under your arms, and your hands. Your stomach and groin area, avoiding the genitals. Your right leg and foot. Your left leg and foot. The back of your neck, your back, and your buttocks. Do not rinse. Discard the cloth and let the area air-dry. Do not put any substances on your body afterward--such as powder, lotion, or perfume--unless you are told to do so by your health care provider. Only use lotions that are recommended by the manufacturer. Put on clean clothes or pajamas. Contact a health care provider if: Your skin gets irritated after scrubbing. You have questions about using your solution or cloth. You swallow any chlorhexidine. Call your local poison control center ([phone] in the [place] Get help right away if: Your eyes itch badly, or they become very red or swollen. Your skin itches badly and is red or swollen. Your hearing changes. You have trouble seeing. You have swelling or tingling in your mouth or throat. You have trouble breathing. These symptoms may represent a serious problem that is an emergency. Do not wait to see if the symptoms will go away. Get medical help right away. Call your local emergency services (911 in the [place] Do not drive yourself to the hospital. Summary Chlorhexidine gluconate (CHG) is a germ-killing  (antiseptic) solution that is used to clean the skin. Cleaning your skin with CHG may help to lower your risk for infection. You may be given CHG to use for bathing. It may be in a bottle or in a prepackaged cloth to use on your skin. Carefully follow your health care provider's instructions and the instructions on the product label. Do not use CHG if you have a chlorhexidine allergy. Contact your health care provider if your skin gets irritated after scrubbing. This information is not intended to replace advice given to you by your health care provider. Make sure you discuss any questions you have with your health care provider. Document Revised: 04/09/2022 Document Reviewed: 02/20/2021 Elsevier Patient Education  2023 [employer].

## 2023-04-23 NOTE — H&P (Signed)
[facility] History and Physical   Reason for Referral:Recurrent umbilical hernia  Referring Physician: Toma Deiters, MD     Chief Complaint   Hernia        Derek Shields is a 52 y.o. male.  HPI: Derek Shields is a 52 yo who has a history of alcohol use and some substance abuse in the past. He is active and works in [employer]. He says that he had an umbilical hernia repair in the past with Dr. Gabriel Cirri and that he used mesh. The patient reports having pain in the right side of this repair and a bulge. He has been doing light duty but this is preventing him from doing his work in [employer]. He is getting ready to get into busy season and wants to proceed with any repair.   History reviewed. No pertinent past medical history.        Past Surgical History:  Procedure Laterality Date   FLEXIBLE SIGMOIDOSCOPY N/A 11/17/2022    Procedure: FLEXIBLE SIGMOIDOSCOPY;  Surgeon: Dolores Frame, MD;  Location: [facility];  Service: Gastroenterology;  Laterality: N/A;   HERNIA REPAIR       LAPAROTOMY N/A 11/17/2022    Procedure: rectal exam under anesthesia ,;  Surgeon: Lucretia Roers, MD;  Location: [facility];  Service: General;  Laterality: N/A;      History reviewed. No pertinent family history.   Social History         Tobacco Use   Smoking status: Every Day      Packs/day: 1.5      Types: Cigarettes   Smokeless tobacco: Never  Vaping Use   Vaping Use: Never used  Substance Use Topics   Alcohol use: Yes      Alcohol/week: 84.0 standard drinks of alcohol      Types: 84 Cans of beer per week      Comment: daily   Drug use: Yes      Frequency: 7.0 times per week      Types: Marijuana, Cocaine      Comment: Coke use is 2-3 times a month      Medications: I have reviewed the patient's current medications. Allergies as of 04/18/2023   No Known Allergies         Medication List     as of April 18, 2023 11:59 PM    You have not been  prescribed any medications.          ROS:  A comprehensive review of systems was negative except for: Respiratory: positive for cough Gastrointestinal: positive for abdominal pain Genitourinary: positive for frequency and hesitancy Musculoskeletal: positive for back pain, neck pain, and joint pain Neurological: positive for dizziness and numbness   Blood pressure 130/84, pulse (!) 113, temperature 98.5 F (36.9 C), temperature source Oral, resp. rate 16, height 5' 10.5" (1.791 m), weight 184 lb (83.5 kg), SpO2 96 %. Physical Exam Vitals reviewed.  HENT:     Head: Normocephalic.     Nose: Nose normal.  Eyes:     Extraocular Movements: Extraocular movements intact.  Cardiovascular:     Rate and Rhythm: Normal rate and regular rhythm.  Pulmonary:     Effort: Pulmonary effort is normal.     Breath sounds: Normal breath sounds.  Abdominal:     General: There is no distension.     Palpations: Abdomen is soft.     Tenderness: There is abdominal tenderness.     Comments: Right  of umbilicus  tenderness, some induration, difficult to say if hernia defect   Neurological:     Mental Status: He is alert.        Results: CT ordered today and results below- reviewed the imaging with radiology and reviewed myself- area of pain coincides with 5mm defect right of midline   Imaging Results (Last 48 hours)  CT Abdomen Pelvis W Contrast   Result Date: 04/18/2023 CLINICAL DATA:  Abdominal wall hernia suspected. EXAM: CT ABDOMEN AND PELVIS WITH CONTRAST TECHNIQUE: Multidetector CT imaging of the abdomen and pelvis was performed using the standard protocol following bolus administration of intravenous contrast. RADIATION DOSE REDUCTION: This exam was performed according to the departmental dose-optimization program which includes automated exposure control, adjustment of the mA and/or kV according to patient size and/or use of iterative reconstruction technique. CONTRAST:  OMNIPAQUE IOHEXOL  300 MG/ML  SOLN COMPARISON:  None Available. FINDINGS: Lower chest: No acute abnormality. Hepatobiliary: Hepatic steatosis.  Normal gallbladder. Pancreas: Unremarkable. No pancreatic ductal dilatation or surrounding inflammatory changes. Spleen: Normal in size without focal abnormality. Adrenals/Urinary Tract: Adrenal glands are unremarkable. Kidneys are normal, without renal calculi, focal lesion, or hydronephrosis. Bladder is unremarkable. Stomach/Bowel: Stomach is within normal limits. Appendix appears normal. No evidence of bowel wall thickening, distention, or inflammatory changes. Vascular/Lymphatic: Aortic atherosclerosis. No enlarged abdominal or pelvic lymph nodes. Reproductive: Enlarged prostate gland. Other: No abdominal wall hernia or abnormality. No abdominopelvic ascites. Prior inguinal hernia repair. Musculoskeletal: No acute or significant osseous findings. IMPRESSION: 1. Hepatic steatosis. 2. No evidence of acute abdominal or pelvic process. 3. No evidence of abdominal wall hernia. 4. Enlarged prostate gland. 5. Aortic atherosclerosis. Aortic Atherosclerosis (ICD10-I70.0). Electronically Signed   By: Ted Mcalpine M.D.   On: 04/18/2023 16:47         Assessment & Plan:  Derek Shields is a 52 y.o. male with a 5mm umbilical hernia that is very small. It is difficult to say if this hernia is causing his symptoms but there is a defect with some fat. He describes pain right in that area. Discussed an open repair given the small size as I do not want to do a laparoscopic procedure where 3 -8mm ports are placed to repair a 5mm hole.  I discussed open repair with primary versus mesh use and discussed risk of bleeding, infection, recurrence and possible mesh.    Discussed 5/6 as the first option. Discussed that he need to not do any cocaine or marijuana between now and then and to control his alcohol use. This would be an outpatient procedure. He should expect about 2 weeks of being at home and  another 4-6 of not lifting over 10 lbs.    All questions were answered to the satisfaction of the patient.      Updated:  Called patient to update. CT on initial read without any obvious hernia. I reviewed imaging and reviewed with radiology. It does look like there may be a small 0.32mm defect just right of midline. I think we should do an open repair to with possible mesh. He wants to proceed with surgery on 5/6. The office will call him to get this schedule.      Lucretia Roers 04/19/2023, 2:46 PM

## 2023-04-24 ENCOUNTER — Encounter (HOSPITAL_COMMUNITY)
Admission: RE | Admit: 2023-04-24 | Discharge: 2023-04-24 | Disposition: A | Discharge status: Home / Self Care | Payer: Medicaid Other | Source: Ambulatory Visit | Attending: General Surgery | Admitting: General Surgery

## 2023-04-24 ENCOUNTER — Encounter (HOSPITAL_COMMUNITY): Payer: Self-pay

## 2023-04-24 VITALS — BP 130/84 | HR 112 | Temp 98.5°F | Resp 18 | Ht 70.5 in | Wt 184.0 lb

## 2023-04-24 DIAGNOSIS — F1411 Cocaine abuse, in remission: Secondary | ICD-10-CM | POA: Diagnosis not present

## 2023-04-24 DIAGNOSIS — Z789 Other specified health status: Secondary | ICD-10-CM | POA: Insufficient documentation

## 2023-04-24 DIAGNOSIS — F17200 Nicotine dependence, unspecified, uncomplicated: Secondary | ICD-10-CM | POA: Diagnosis present

## 2023-04-24 LAB — RAPID URINE DRUG SCREEN, HOSP PERFORMED
Amphetamines: NOT DETECTED
Barbiturates: NOT DETECTED
Benzodiazepines: NOT DETECTED
Cocaine: POSITIVE — AB
Opiates: NOT DETECTED
Tetrahydrocannabinol: POSITIVE — AB

## 2023-04-24 LAB — COMPREHENSIVE METABOLIC PANEL
ALT: 64 U/L — ABNORMAL HIGH (ref 0–44)
AST: 69 U/L — ABNORMAL HIGH (ref 15–41)
Albumin: 3.8 g/dL (ref 3.5–5.0)
Alkaline Phosphatase: 42 U/L (ref 38–126)
Anion gap: 7 (ref 5–15)
BUN: 10 mg/dL (ref 6–20)
CO2: 25 mmol/L (ref 22–32)
Calcium: 9.9 mg/dL (ref 8.9–10.3)
Chloride: 107 mmol/L (ref 98–111)
Creatinine, Ser: 0.81 mg/dL (ref 0.61–1.24)
GFR, Estimated: >60 mL/min (ref >60)
Glucose, Bld: 98 mg/dL (ref 70–99)
Potassium: 3.9 mmol/L (ref 3.5–5.1)
Sodium: 139 mmol/L (ref 135–145)
Total Bilirubin: 1.1 mg/dL (ref 0.3–1.2)
Total Protein: 6.9 g/dL (ref 6.5–8.1)

## 2023-04-25 NOTE — Pre-Procedure Instructions (Signed)
Dr Johnnette Litter notified of patient being cocaine positive. Messaged Dr Henreitta Leber and Lenna Sciara at office to let them know. Patient needs to be rescheduled and cocaine free for 2 weeks before we can proceed per Dr Johnnette Litter and anesthesia guidelines.

## 2023-04-29 DIAGNOSIS — K429 Umbilical hernia without obstruction or gangrene: Secondary | ICD-10-CM | POA: Insufficient documentation

## 2023-05-09 NOTE — Patient Instructions (Signed)
Derek Shields  05/09/2023     @PREFPERIOPPHARMACY @   Your procedure is scheduled on  05/13/2023.   Report to [facility] at  0800  A.M.   Call this number if you have problems the morning of surgery:  [phone]  If you experience any cold or flu symptoms such as cough, fever, chills, shortness of breath, etc. between now and your scheduled surgery, please notify us at the above number.   Remember:  Do not eat or drink after midnight.      Take these medicines the morning of surgery with A SIP OF WATER                                            None.     Do not wear jewelry, make-up or nail polish.  Do not wear lotions, powders, or perfumes, or deodorant.  Do not shave 48 hours prior to surgery.  Men may shave face and neck.  Do not bring valuables to the hospital.  [facility] is not responsible for any belongings or valuables.  Contacts, dentures or bridgework may not be worn into surgery.  Leave your suitcase in the car.  After surgery it may be brought to your room.  For patients admitted to the hospital, discharge time will be determined by your treatment team.  Patients discharged the day of surgery will not be allowed to drive home and must have someone with them for 24 hours.    Special instructions:   DO NOT smoke tobacco or vape for 24 hours before your procedure.  Please read over the following fact sheets that you were given. Pain Booklet, Coughing and Deep Breathing, Surgical Site Infection Prevention, Anesthesia Post-op Instructions, and Care and Recovery After Surgery      Open Hernia Repair, Adult, Care After What can I expect after the procedure? After the procedure, it is common to have: Mild discomfort. Slight bruising. Mild swelling. Pain in the belly (abdomen). A small amount of blood from the cut from surgery (incision). Follow these instructions at home: Your doctor may give you more specific instructions. If you have  problems, call your doctor. Medicines Take over-the-counter and prescription medicines only as told by your doctor. If told, take steps to prevent problems with pooping (constipation). You may need to: Drink enough fluid to keep your pee (urine) pale yellow. Take medicines. You will be told what medicines to take. Eat foods that are high in fiber. These include beans, whole grains, and fresh fruits and vegetables. Limit foods that are high in fat and sugar. These include fried or sweet foods. Ask your doctor if you should avoid driving or using machines while you are taking your medicine. Incision care  Follow instructions from your doctor about how to take care of your incision. Make sure you: Wash your hands with soap and water for at least 20 seconds before and after you change your bandage (dressing). If you cannot use soap and water, use hand sanitizer. Change your bandage. Leave stitches or skin glue in place for at least 2 weeks. Leave tape strips alone unless you are told to take them off. You may trim the edges of the tape strips if they curl up. Check your incision every day for signs of infection. Check for: More redness, swelling, or pain. More  fluid or blood. Warmth. Pus or a bad smell. Wear loose, soft clothing while your incision heals. Activity  Rest as told by your doctor. Do not lift anything that is heavier than 10 lb (4.5 kg), or the limit that you are told. Do not play contact sports until your doctor says that this is safe. If you were given a sedative during your procedure, do not drive or use machines until your doctor says that it is safe. A sedative is a medicine that helps you relax. Return to your normal activities when your doctor says that it is safe. General instructions Do not take baths, swim, or use a hot tub. Ask your doctor about taking showers or sponge baths. Hold a pillow over your belly when you cough or sneeze. This helps with pain. Do not  smoke or use any products that contain nicotine or tobacco. If you need help quitting, ask your doctor. Keep all follow-up visits. Contact a doctor if: You have any of these signs of infection in or around your incision: More redness, swelling, or pain. More fluid or blood. Warmth. Pus. A bad smell. You have a fever or chills. You have blood in your poop (stool). You have not pooped (had a bowel movement) in 2-3 days. Medicine does not help your pain. Get help right away if: You have chest pain, or you are short of breath. You feel faint or light-headed. You have very bad pain. You vomit and your pain is worse. You have pain, swelling, or redness in a leg. These symptoms may be an emergency. Get help right away. Call your local emergency services (911 in the [place] Do not wait to see if the symptoms will go away. Do not drive yourself to the hospital. Summary After this procedure, it is common to have mild discomfort, slight bruising, and mild swelling. Follow instructions from your doctor about how to take care of your cut from surgery (incision). Check every day for signs of infection. Do not lift heavy objects or play contact sports until your doctor says it is safe. Return to your normal activities as told by your doctor. This information is not intended to replace advice given to you by your health care provider. Make sure you discuss any questions you have with your health care provider. Document Revised: 07/25/2020 Document Reviewed: 07/25/2020 Elsevier Patient Education  2023 [employer] General Anesthesia, Adult, Care After The following information offers guidance on how to care for yourself after your procedure. Your health care provider may also give you more specific instructions. If you have problems or questions, contact your health care provider. What can I expect after the procedure? After the procedure, it is common for people to: Have pain or discomfort at the  IV site. Have nausea or vomiting. Have a sore throat or hoarseness. Have trouble concentrating. Feel cold or chills. Feel weak, sleepy, or tired (fatigue). Have soreness and body aches. These can affect parts of the body that were not involved in surgery. Follow these instructions at home: For the time period you were told by your health care provider:  Rest. Do not participate in activities where you could fall or become injured. Do not drive or use machinery. Do not drink alcohol. Do not take sleeping pills or medicines that cause drowsiness. Do not make important decisions or sign legal documents. Do not take care of children on your own. General instructions Drink enough fluid to keep your urine pale yellow. If you have sleep  apnea, surgery and certain medicines can increase your risk for breathing problems. Follow instructions from your health care provider about wearing your sleep device: Anytime you are sleeping, including during daytime naps. While taking prescription pain medicines, sleeping medicines, or medicines that make you drowsy. Return to your normal activities as told by your health care provider. Ask your health care provider what activities are safe for you. Take over-the-counter and prescription medicines only as told by your health care provider. Do not use any products that contain nicotine or tobacco. These products include cigarettes, chewing tobacco, and vaping devices, such as e-cigarettes. These can delay incision healing after surgery. If you need help quitting, ask your health care provider. Contact a health care provider if: You have nausea or vomiting that does not get better with medicine. You vomit every time you eat or drink. You have pain that does not get better with medicine. You cannot urinate or have bloody urine. You develop a skin rash. You have a fever. Get help right away if: You have trouble breathing. You have chest pain. You vomit  blood. These symptoms may be an emergency. Get help right away. Call 911. Do not wait to see if the symptoms will go away. Do not drive yourself to the hospital. Summary After the procedure, it is common to have a sore throat, hoarseness, nausea, vomiting, or to feel weak, sleepy, or fatigue. For the time period you were told by your health care provider, do not drive or use machinery. Get help right away if you have difficulty breathing, have chest pain, or vomit blood. These symptoms may be an emergency. This information is not intended to replace advice given to you by your health care provider. Make sure you discuss any questions you have with your health care provider. Document Revised: 03/09/2022 Document Reviewed: 03/09/2022 Elsevier Patient Education  2023 Else[employer] Inc. How to Use Chlorhexidine Before Surgery Chlorhexidine gluconate (CHG) is a germ-killing (antiseptic) solution that is used to clean the skin. It can get rid of the bacteria that normally live on the skin and can keep them away for about 24 hours. To clean your skin with CHG, you may be given: A CHG solution to use in the shower or as part of a sponge bath. A prepackaged cloth that contains CHG. Cleaning your skin with CHG may help lower the risk for infection: While you are staying in the intensive care unit of the hospital. If you have a vascular access, such as a central line, to provide short-term or long-term access to your veins. If you have a catheter to drain urine from your bladder. If you are on a ventilator. A ventilator is a machine that helps you breathe by moving air in and out of your lungs. After surgery. What are the risks? Risks of using CHG include: A skin reaction. Hearing loss, if CHG gets in your ears and you have a perforated eardrum. Eye injury, if CHG gets in your eyes and is not rinsed out. The CHG product catching fire. Make sure that you avoid smoking and flames after applying CHG to your  skin. Do not use CHG: If you have a chlorhexidine allergy or have previously reacted to chlorhexidine. On babies younger than 80 months of age. How to use CHG solution Use CHG only as told by your health care provider, and follow the instructions on the label. Use the full amount of CHG as directed. Usually, this is one bottle. During a shower Follow these  steps when using CHG solution during a shower (unless your health care provider gives you different instructions): Start the shower. Use your normal soap and shampoo to wash your face and hair. Turn off the shower or move out of the shower stream. Pour the CHG onto a clean washcloth. Do not use any type of brush or rough-edged sponge. Starting at your neck, lather your body down to your toes. Make sure you follow these instructions: If you will be having surgery, pay special attention to the part of your body where you will be having surgery. Scrub this area for at least 1 minute. Do not use CHG on your head or face. If the solution gets into your ears or eyes, rinse them well with water. Avoid your genital area. Avoid any areas of skin that have broken skin, cuts, or scrapes. Scrub your back and under your arms. Make sure to wash skin folds. Let the lather sit on your skin for 1-2 minutes or as long as told by your health care provider. Thoroughly rinse your entire body in the shower. Make sure that all body creases and crevices are rinsed well. Dry off with a clean towel. Do not put any substances on your body afterward--such as powder, lotion, or perfume--unless you are told to do so by your health care provider. Only use lotions that are recommended by the manufacturer. Put on clean clothes or pajamas. If it is the night before your surgery, sleep in clean sheets.  During a sponge bath Follow these steps when using CHG solution during a sponge bath (unless your health care provider gives you different instructions): Use your normal  soap and shampoo to wash your face and hair. Pour the CHG onto a clean washcloth. Starting at your neck, lather your body down to your toes. Make sure you follow these instructions: If you will be having surgery, pay special attention to the part of your body where you will be having surgery. Scrub this area for at least 1 minute. Do not use CHG on your head or face. If the solution gets into your ears or eyes, rinse them well with water. Avoid your genital area. Avoid any areas of skin that have broken skin, cuts, or scrapes. Scrub your back and under your arms. Make sure to wash skin folds. Let the lather sit on your skin for 1-2 minutes or as long as told by your health care provider. Using a different clean, wet washcloth, thoroughly rinse your entire body. Make sure that all body creases and crevices are rinsed well. Dry off with a clean towel. Do not put any substances on your body afterward--such as powder, lotion, or perfume--unless you are told to do so by your health care provider. Only use lotions that are recommended by the manufacturer. Put on clean clothes or pajamas. If it is the night before your surgery, sleep in clean sheets. How to use CHG prepackaged cloths Only use CHG cloths as told by your health care provider, and follow the instructions on the label. Use the CHG cloth on clean, dry skin. Do not use the CHG cloth on your head or face unless your health care provider tells you to. When washing with the CHG cloth: Avoid your genital area. Avoid any areas of skin that have broken skin, cuts, or scrapes. Before surgery Follow these steps when using a CHG cloth to clean before surgery (unless your health care provider gives you different instructions): Using the CHG cloth, vigorously scrub  the part of your body where you will be having surgery. Scrub using a back-and-forth motion for 3 minutes. The area on your body should be completely wet with CHG when you are done  scrubbing. Do not rinse. Discard the cloth and let the area air-dry. Do not put any substances on the area afterward, such as powder, lotion, or perfume. Put on clean clothes or pajamas. If it is the night before your surgery, sleep in clean sheets.  For general bathing Follow these steps when using CHG cloths for general bathing (unless your health care provider gives you different instructions). Use a separate CHG cloth for each area of your body. Make sure you wash between any folds of skin and between your fingers and toes. Wash your body in the following order, switching to a new cloth after each step: The front of your neck, shoulders, and chest. Both of your arms, under your arms, and your hands. Your stomach and groin area, avoiding the genitals. Your right leg and foot. Your left leg and foot. The back of your neck, your back, and your buttocks. Do not rinse. Discard the cloth and let the area air-dry. Do not put any substances on your body afterward--such as powder, lotion, or perfume--unless you are told to do so by your health care provider. Only use lotions that are recommended by the manufacturer. Put on clean clothes or pajamas. Contact a health care provider if: Your skin gets irritated after scrubbing. You have questions about using your solution or cloth. You swallow any chlorhexidine. Call your local poison control center ([phone] in the [place] Get help right away if: Your eyes itch badly, or they become very red or swollen. Your skin itches badly and is red or swollen. Your hearing changes. You have trouble seeing. You have swelling or tingling in your mouth or throat. You have trouble breathing. These symptoms may represent a serious problem that is an emergency. Do not wait to see if the symptoms will go away. Get medical help right away. Call your local emergency services (911 in the [place] Do not drive yourself to the hospital. Summary Chlorhexidine  gluconate (CHG) is a germ-killing (antiseptic) solution that is used to clean the skin. Cleaning your skin with CHG may help to lower your risk for infection. You may be given CHG to use for bathing. It may be in a bottle or in a prepackaged cloth to use on your skin. Carefully follow your health care provider's instructions and the instructions on the product label. Do not use CHG if you have a chlorhexidine allergy. Contact your health care provider if your skin gets irritated after scrubbing. This information is not intended to replace advice given to you by your health care provider. Make sure you discuss any questions you have with your health care provider. Document Revised: 04/09/2022 Document Reviewed: 02/20/2021 Elsevier Patient Education  2023 [employer].

## 2023-05-10 ENCOUNTER — Encounter (HOSPITAL_COMMUNITY)
Admission: RE | Admit: 2023-05-10 | Discharge: 2023-05-10 | Disposition: A | Discharge status: Home / Self Care | Payer: Medicaid Other | Source: Ambulatory Visit | Attending: General Surgery | Admitting: General Surgery

## 2023-05-10 ENCOUNTER — Encounter (HOSPITAL_COMMUNITY): Payer: Self-pay

## 2023-05-10 VITALS — BP 130/84 | HR 88 | Temp 98.5°F | Resp 18 | Ht 70.5 in | Wt 184.0 lb

## 2023-05-10 DIAGNOSIS — F1410 Cocaine abuse, uncomplicated: Secondary | ICD-10-CM | POA: Diagnosis not present

## 2023-05-10 DIAGNOSIS — Z01812 Encounter for preprocedural laboratory examination: Secondary | ICD-10-CM | POA: Insufficient documentation

## 2023-05-10 HISTORY — DX: Other psychoactive substance abuse, uncomplicated: F19.10

## 2023-05-10 LAB — RAPID URINE DRUG SCREEN, HOSP PERFORMED
Amphetamines: NOT DETECTED
Barbiturates: NOT DETECTED
Benzodiazepines: NOT DETECTED
Cocaine: NOT DETECTED
Opiates: NOT DETECTED
Tetrahydrocannabinol: POSITIVE — AB

## 2023-05-13 ENCOUNTER — Other Ambulatory Visit: Payer: Self-pay

## 2023-05-13 ENCOUNTER — Ambulatory Visit (HOSPITAL_COMMUNITY): Payer: Medicaid Other | Admitting: Anesthesiology

## 2023-05-13 ENCOUNTER — Ambulatory Visit (HOSPITAL_COMMUNITY)
Admission: RE | Admit: 2023-05-13 | Discharge: 2023-05-13 | Disposition: A | Discharge status: Home / Self Care | Payer: Medicaid Other | Attending: General Surgery | Admitting: General Surgery

## 2023-05-13 ENCOUNTER — Encounter (HOSPITAL_COMMUNITY): Payer: Self-pay | Admitting: General Surgery

## 2023-05-13 ENCOUNTER — Ambulatory Visit (HOSPITAL_BASED_OUTPATIENT_CLINIC_OR_DEPARTMENT_OTHER): Payer: Medicaid Other | Admitting: Anesthesiology

## 2023-05-13 ENCOUNTER — Encounter (HOSPITAL_COMMUNITY)
Admission: RE | Disposition: A | Discharge status: Home / Self Care | Payer: Self-pay | Source: Home / Self Care | Attending: General Surgery

## 2023-05-13 DIAGNOSIS — K429 Umbilical hernia without obstruction or gangrene: Secondary | ICD-10-CM | POA: Insufficient documentation

## 2023-05-13 DIAGNOSIS — F17210 Nicotine dependence, cigarettes, uncomplicated: Secondary | ICD-10-CM | POA: Diagnosis not present

## 2023-05-13 HISTORY — PX: UMBILICAL HERNIA REPAIR: SHX196

## 2023-05-13 SURGERY — REPAIR, HERNIA, UMBILICAL, ADULT
Anesthesia: General | Site: Abdomen

## 2023-05-13 MED ORDER — PROPOFOL 10 MG/ML IV BOLUS
INTRAVENOUS | Status: DC | PRN
  Administered 2023-05-13: 50 mg via INTRAVENOUS
  Administered 2023-05-13: 200 mg via INTRAVENOUS

## 2023-05-13 MED ORDER — PROPOFOL 10 MG/ML IV BOLUS
INTRAVENOUS | Status: AC
  Filled 2023-05-13: qty 20

## 2023-05-13 MED ORDER — CHLORHEXIDINE GLUCONATE CLOTH 2 % EX PADS: 6.0000 | MEDICATED_PAD | Freq: Once | CUTANEOUS | Status: DC

## 2023-05-13 MED ORDER — EPHEDRINE 5 MG/ML INJ
INTRAVENOUS | Status: AC
  Filled 2023-05-13: qty 5

## 2023-05-13 MED ORDER — LACTATED RINGERS IV SOLN: INTRAVENOUS | Status: DC

## 2023-05-13 MED ORDER — FENTANYL CITRATE (PF) 100 MCG/2ML IJ SOLN
INTRAMUSCULAR | Status: DC | PRN
  Administered 2023-05-13: 50 ug via INTRAVENOUS
  Administered 2023-05-13 (×2): 100 ug via INTRAVENOUS

## 2023-05-13 MED ORDER — SODIUM CHLORIDE 0.9 % IR SOLN
Status: DC | PRN
  Administered 2023-05-13: 1000 mL

## 2023-05-13 MED ORDER — ONDANSETRON HCL 4 MG PO TABS: 4.0000 mg | ORAL_TABLET | Freq: Three times a day (TID) | ORAL | 1 refills | Status: DC | PRN

## 2023-05-13 MED ORDER — ROCURONIUM BROMIDE 10 MG/ML (PF) SYRINGE
PREFILLED_SYRINGE | INTRAVENOUS | Status: AC
  Filled 2023-05-13: qty 10

## 2023-05-13 MED ORDER — FENTANYL CITRATE (PF) 250 MCG/5ML IJ SOLN
INTRAMUSCULAR | Status: AC
  Filled 2023-05-13: qty 5

## 2023-05-13 MED ORDER — ONDANSETRON HCL 4 MG/2ML IJ SOLN
INTRAMUSCULAR | Status: DC | PRN
  Administered 2023-05-13: 4 mg via INTRAVENOUS

## 2023-05-13 MED ORDER — EPHEDRINE SULFATE (PRESSORS) 50 MG/ML IJ SOLN
INTRAMUSCULAR | Status: DC | PRN
  Administered 2023-05-13 (×2): 5 mg via INTRAVENOUS

## 2023-05-13 MED ORDER — ONDANSETRON HCL 4 MG/2ML IJ SOLN
INTRAMUSCULAR | Status: AC
  Filled 2023-05-13: qty 2

## 2023-05-13 MED ORDER — CEFAZOLIN SODIUM-DEXTROSE 2-4 GM/100ML-% IV SOLN
INTRAVENOUS | Status: AC
  Filled 2023-05-13: qty 100

## 2023-05-13 MED ORDER — CHLORHEXIDINE GLUCONATE 0.12 % MT SOLN: 15.0000 mL | Freq: Once | OROMUCOSAL | Status: DC

## 2023-05-13 MED ORDER — CEFAZOLIN SODIUM-DEXTROSE 2-4 GM/100ML-% IV SOLN
2.0000 g | INTRAVENOUS | Status: AC
  Administered 2023-05-13: 2 g via INTRAVENOUS

## 2023-05-13 MED ORDER — ROCURONIUM BROMIDE 100 MG/10ML IV SOLN
INTRAVENOUS | Status: DC | PRN
  Administered 2023-05-13: 50 mg via INTRAVENOUS

## 2023-05-13 MED ORDER — OXYCODONE HCL 5 MG PO TABS: 5.0000 mg | ORAL_TABLET | ORAL | 0 refills | Status: DC | PRN

## 2023-05-13 MED ORDER — CHLORHEXIDINE GLUCONATE 0.12 % MT SOLN
OROMUCOSAL | Status: AC
  Filled 2023-05-13: qty 15

## 2023-05-13 MED ORDER — SUGAMMADEX SODIUM 500 MG/5ML IV SOLN
INTRAVENOUS | Status: DC | PRN
  Administered 2023-05-13: 200 mg via INTRAVENOUS

## 2023-05-13 MED ORDER — BUPIVACAINE HCL (PF) 0.5 % IJ SOLN
INTRAMUSCULAR | Status: AC
  Filled 2023-05-13: qty 30

## 2023-05-13 MED ORDER — DEXAMETHASONE SODIUM PHOSPHATE 10 MG/ML IJ SOLN
INTRAMUSCULAR | Status: DC | PRN
  Administered 2023-05-13: 5 mg via INTRAVENOUS

## 2023-05-13 MED ORDER — LIDOCAINE HCL (CARDIAC) PF 100 MG/5ML IV SOSY
PREFILLED_SYRINGE | INTRAVENOUS | Status: DC | PRN
  Administered 2023-05-13: 100 mg via INTRAVENOUS

## 2023-05-13 MED ORDER — DEXAMETHASONE SODIUM PHOSPHATE 10 MG/ML IJ SOLN
INTRAMUSCULAR | Status: AC
  Filled 2023-05-13: qty 1

## 2023-05-13 MED ORDER — LIDOCAINE HCL (PF) 2 % IJ SOLN
INTRAMUSCULAR | Status: AC
  Filled 2023-05-13: qty 5

## 2023-05-13 MED ORDER — BUPIVACAINE HCL (PF) 0.5 % IJ SOLN
INTRAMUSCULAR | Status: DC | PRN
  Administered 2023-05-13: 30 mL

## 2023-05-13 MED ORDER — ORAL CARE MOUTH RINSE: 15.0000 mL | Freq: Once | OROMUCOSAL | Status: DC

## 2023-05-13 SURGICAL SUPPLY — 30 items
ADH SKN CLS APL DERMABOND .7 (GAUZE/BANDAGES/DRESSINGS) ×1
APL PRP STRL LF DISP 70% ISPRP (MISCELLANEOUS) ×1
BLADE SURG 15 STRL LF DISP TIS (BLADE) ×1 IMPLANT
BLADE SURG 15 STRL SS (BLADE) ×1
CHLORAPREP W/TINT 26 (MISCELLANEOUS) ×1 IMPLANT
CLOTH BEACON ORANGE TIMEOUT ST (SAFETY) ×1 IMPLANT
COVER LIGHT HANDLE STERIS (MISCELLANEOUS) ×2 IMPLANT
DERMABOND ADVANCED .7 DNX12 (GAUZE/BANDAGES/DRESSINGS) ×1 IMPLANT
ELECT REM PT RETURN 9FT ADLT (ELECTROSURGICAL) ×1
ELECTRODE REM PT RTRN 9FT ADLT (ELECTROSURGICAL) ×1 IMPLANT
GAUZE 4X4 16PLY ~~LOC~~+RFID DBL (SPONGE) ×1 IMPLANT
GLOVE BIO SURGEON STRL SZ 6.5 (GLOVE) ×1 IMPLANT
GLOVE BIOGEL PI IND STRL 6.5 (GLOVE) ×1 IMPLANT
GLOVE BIOGEL PI IND STRL 7.0 (GLOVE) ×2 IMPLANT
GLOVE SURG SS PI 7.0 STRL IVOR (GLOVE) IMPLANT
GOWN STRL REUS W/TWL LRG LVL3 (GOWN DISPOSABLE) ×2 IMPLANT
KIT TURNOVER KIT A (KITS) ×1 IMPLANT
MANIFOLD NEPTUNE II (INSTRUMENTS) ×1 IMPLANT
NDL HYPO 21X1.5 SAFETY (NEEDLE) ×1 IMPLANT
NEEDLE HYPO 21X1.5 SAFETY (NEEDLE) ×1 IMPLANT
NS IRRIG 1000ML POUR BTL (IV SOLUTION) ×1 IMPLANT
PACK MINOR (CUSTOM PROCEDURE TRAY) ×1 IMPLANT
PAD ARMBOARD 7.5X6 YLW CONV (MISCELLANEOUS) ×1 IMPLANT
PENCIL SMOKE EVACUATOR (MISCELLANEOUS) ×1 IMPLANT
SET BASIN LINEN APH (SET/KITS/TRAYS/PACK) ×1 IMPLANT
SUT ETHIBOND NAB MO 7 #0 18IN (SUTURE) ×1 IMPLANT
SUT MNCRL AB 4-0 PS2 18 (SUTURE) ×1 IMPLANT
SUT VIC AB 3-0 SH 27 (SUTURE) ×1
SUT VIC AB 3-0 SH 27X BRD (SUTURE) ×1 IMPLANT
SYR 30ML LL (SYRINGE) ×2 IMPLANT

## 2023-05-13 NOTE — Op Note (Signed)
[facility] Operative Note  05/13/23  Preoperative Diagnosis: Recurrent Umbilical hernia    Postoperative Diagnosis: Same   Procedure(s) Performed: Primary repair umbilical hernia, 5 mm defect    Surgeon: Leatrice Jewels. Henreitta Leber, MD   Assistants: No qualified resident was available    Anesthesia: General endotracheal   Anesthesiologist: Molli Barrows, MD    Specimens:  None    Estimated Blood Loss: Minimal   Blood Replacement: None    Complications: None   Wound Class: Clean    Operative Indications: Mr. Collinson is a 52 yo who is active and felt a pull/ tugging feeling at his prior umbilical hernia repair. He had a prior repair with mesh he thought years ago. A CT was done and the initial read did not find a hernia but on further review with radiology a 0.5cm defect was noted right of midline with some fat in the area. We discussed repair and potential for primary versus mesh repair and risk of bleeding, infection, not improving his pain.   Findings: Small 5mm defect right of midline in the area corresponding to the CT, intraperitoneal fat through the defect and prolene suture removed   Procedure: The patient was taken to the operating room and placed supine. General endotracheal anesthesia was induced. Intravenous antibiotics were administered per protocol.  The abdomen was prepared and draped in the usual sterile fashion.   The umbilical hernia defect was difficult to feel in the inferior aspect right of midline. A small lump was felt. An incision was made under the umbilicus, and carried down through the subcutaneous tissue with electrocautery.  Dissection was performed down to the level of the fascia, exposing some preperitoneal/ intraperitoneal fat that was excised.  Old prolene suture was excised.  The defect was 5 mm and I did not want to open it up further to get a mesh in given the small size. I could see the two edges of fascia after excising the  peritoneal fat.  Using  0 Ethibond sutures the defect was closed primarily in the interrupted fashion.   The umbilical stalk had not been taken.  Hemostasis was confirmed. 3-0 Vicryl closed the cavity. Bupivacaine was injected. The skin was closed with a running 4-0 Monocryl suture and dermabond.    All counts were correct at the end of the case. The patient was awakened from anesthesia and extubated without complication.  The patient went to the PACU in stable condition.  Algis Greenhouse, MD [facility] [address] [place], [place] [address] [phone] (office)

## 2023-05-13 NOTE — Anesthesia Procedure Notes (Signed)
Procedure Name: Intubation Date/Time: 05/13/2023 12:58 PM  Performed by: Franco Nones, CRNAPre-anesthesia Checklist: Patient identified, Patient being monitored, Timeout performed, Emergency Drugs available and Suction available Patient Re-evaluated:Patient Re-evaluated prior to induction Oxygen Delivery Method: Circle system utilized Preoxygenation: Pre-oxygenation with 100% oxygen Induction Type: IV induction Ventilation: Mask ventilation without difficulty Laryngoscope Size: Mac and 3 Grade View: Grade I Tube type: Oral Tube size: 7.5 mm Number of attempts: 1 Airway Equipment and Method: Stylet Placement Confirmation: ETT inserted through vocal cords under direct vision, positive ETCO2 and breath sounds checked- equal and bilateral Secured at: 21 cm Tube secured with: Tape Dental Injury: Teeth and Oropharynx as per pre-operative assessment

## 2023-05-13 NOTE — Interval H&P Note (Signed)
History and Physical Interval Note:  05/13/2023 12:44 PM  Derek Shields  has presented today for surgery, with the diagnosis of Umbilical hernia.  The various methods of treatment have been discussed with the patient and family. After consideration of risks, benefits and other options for treatment, the patient has consented to  Procedure(s): HERNIA REPAIR UMBILICAL ADULT (N/A) as a surgical intervention.  The patient's history has been reviewed, patient examined, no change in status, stable for surgery.  I have reviewed the patient's chart and labs.  Questions were answered to the patient's satisfaction.    Small fat containing hernai defect right of midline on the CT after reviewing with radiology. Will do suture versus mesh.  Lucretia Roers

## 2023-05-13 NOTE — Anesthesia Preprocedure Evaluation (Signed)
Anesthesia Evaluation  Patient identified by MRN, date of birth, ID band Patient awake    Reviewed: Allergy & Precautions, H&P , NPO status , Patient's Chart, lab work & pertinent test results  Airway Mallampati: II  TM Distance: >3 FB Neck ROM: Full    Dental  (+) Dental Advisory Given, Poor Dentition, Missing, Loose,    Pulmonary Current Smoker and Patient abstained from smoking.   Pulmonary exam normal breath sounds clear to auscultation       Cardiovascular negative cardio ROS Normal cardiovascular exam Rhythm:Regular Rate:Normal     Neuro/Psych negative neurological ROS  negative psych ROS   GI/Hepatic negative GI ROS,,,(+)     substance abuse (last use more than a week ago)  alcohol use, cocaine use and marijuana use  Endo/Other  negative endocrine ROS    Renal/GU negative Renal ROS  negative genitourinary   Musculoskeletal negative musculoskeletal ROS (+)    Abdominal   Peds negative pediatric ROS (+)  Hematology negative hematology ROS (+)   Anesthesia Other Findings   Reproductive/Obstetrics negative OB ROS                             Anesthesia Physical Anesthesia Plan  ASA: 3  Anesthesia Plan: General   Post-op Pain Management: Dilaudid IV   Induction: Intravenous  PONV Risk Score and Plan: 2 and Ondansetron and Dexamethasone  Airway Management Planned: Oral ETT  Additional Equipment:   Intra-op Plan:   Post-operative Plan:   Informed Consent: I have reviewed the patients History and Physical, chart, labs and discussed the procedure including the risks, benefits and alternatives for the proposed anesthesia with the patient or authorized representative who has indicated his/her understanding and acceptance.     Dental advisory given  Plan Discussed with: CRNA and Surgeon  Anesthesia Plan Comments:         Anesthesia Quick Evaluation

## 2023-05-13 NOTE — Progress Notes (Signed)
[facility]  Update stepfather and mother. Rx sent to [facility]. Needs to use tylenol and ibuprofen too. Binder ordered. Will see 6/19 in follow up.  No heavy lifting > 10 lbs, excessive bending, pushing, pulling, or squatting for 4 weeks after surgery.   Algis Greenhouse, MD [facility] [address] [place], [place] [address] [phone] (office)

## 2023-05-13 NOTE — Anesthesia Postprocedure Evaluation (Signed)
Anesthesia Post Note  Patient: Derek Shields  Procedure(s) Performed: PRIMARY ADULT UMBILICAL HERNIA REPAIR (Abdomen)  Patient location during evaluation: Phase II Anesthesia Type: General Level of consciousness: awake and alert and oriented Pain management: pain level controlled Vital Signs Assessment: post-procedure vital signs reviewed and stable Respiratory status: spontaneous breathing, nonlabored ventilation and respiratory function stable Cardiovascular status: blood pressure returned to baseline and stable Postop Assessment: no apparent nausea or vomiting Anesthetic complications: no  No notable events documented.   Last Vitals:  Vitals:   05/13/23 1410 05/13/23 1427  BP: 132/81 129/89  Pulse: 87 75  Resp: 11 18  Temp:  36.8 C  SpO2: 92% 94%    Last Pain:  Vitals:   05/13/23 1427  TempSrc: Oral  PainSc: 0-No pain                 Zhanae Proffit C [place]

## 2023-05-13 NOTE — Transfer of Care (Signed)
Immediate Anesthesia Transfer of Care Note  Patient: Derek Shields  Procedure(s) Performed: PRIMARY ADULT UMBILICAL HERNIA REPAIR (Abdomen)  Patient Location: PACU  Anesthesia Type:General  Level of Consciousness: awake and patient cooperative  Airway & Oxygen Therapy: Patient Spontanous Breathing  Post-op Assessment: Report given to RN and Post -op Vital signs reviewed and stable  Post vital signs: Reviewed and stable  Last Vitals:  Vitals Value Taken Time  BP 129/91 05/13/23 1354  Temp 98 05/13/23  1355  Pulse 99 05/13/23 1355  Resp 24 05/13/23 1355  SpO2 98 % 05/13/23 1355  Vitals shown include unvalidated device data.  Last Pain:  Vitals:   05/13/23 0839  TempSrc: Oral  PainSc: 0-No pain         Complications: No notable events documented.

## 2023-05-13 NOTE — Discharge Instructions (Addendum)
Discharge Instructions Hernia:  Common Complaints: Pain at the incision site is common. This will improve with time. Take your pain medications as described below. Some nausea is common and poor appetite. The main goal is to stay hydrated the first few days after surgery.   Diet/ Activity: Diet as tolerated. You may not have an appetite, but it is important to stay hydrated. Drink 64 ounces of water a day. Your appetite will return with time.  Shower per your regular routine daily.  Do not take hot showers. Take warm showers that are less than 10 minutes. Rest and listen to your body, but do not remain in bed all day.Walk everyday for at least 15-20 minutes.  Deep cough and move around every 1-2 hours in the first few days after surgery. Do not pick at the dermabond glue on your incision sites.  This glue film will remain in place for 1-2 weeks and will start to peel off. Do not place lotions or balms on your incision unless instructed to specifically by Dr. Henreitta Leber. Do not lift > 10 lbs, perform excessive bending, pushing, pulling, squatting for 4-6 weeks after surgery. Where your abdominal binder with activity as much as possible. The activity restrictions and the abdominal binder are to prevent hernia formation at your incision while you are healing.   Pain Expectations and Narcotics: -After surgery you will have pain associated with your incisions and this is normal. The pain is muscular and nerve pain, and will get better with time. -You are encouraged and expected to take non narcotic medications like tylenol and ibuprofen (when able) to treat pain as multiple modalities can aid with pain treatment. -Narcotics are only used when pain is severe or there is breakthrough pain. -You are not expected to have a pain score of 0 after surgery, as we cannot prevent pain. A pain score of 3-4 that allows you to be functional, move, walk, and tolerate some activity is the goal. The pain will continue  to improve over the days after surgery and is dependent on your surgery. -Due to [place] law, we are only able to give a certain amount of pain medication to treat post operative pain, and we only give additional narcotics on a patient by patient basis.  -For most laparoscopic surgery, studies have shown that the majority of patients only need 10-15 narcotic pills, and for open surgeries most patients only need 15-20.   -Having appropriate expectations of pain and knowledge of pain management with non narcotics is important as we do not want anyone to become addicted to narcotic pain medication.  -Using ice packs in the first 48 hours and heating pads after 48 hours, wearing an abdominal binder (when recommended), and using over the counter medications are all ways to help with pain management.   -Simple acts like meditation and mindfulness practices after surgery can also help with pain control and research has proven the benefit of these practices.  Medication: Take tylenol and ibuprofen as needed for pain control, alternating every 4-6 hours.  Example:  Tylenol 1000mg  @ 6am, 12noon, 6pm, (Do not exceed 4000mg  of tylenol a day). Ibuprofen 800mg  @ 9am, 3pm, 9pm, 3am (Do not exceed 3600mg  of ibuprofen a day).  Take Roxicodone for breakthrough pain every 4 hours.  Take Colace for constipation related to narcotic pain medication. If you do not have a bowel movement in 2 days, take Miralax over the counter.  Drink plenty of water to also prevent constipation.  Contact Information: If you have questions or concerns, please call our office, [phone], Monday- Thursday 8AM-5PM and Friday 8AM-12Noon.  If it is after hours or on the weekend, please call [facility], [phone], [phone], and ask to speak to the surgeon on call for Dr. Henreitta Leber at [facility].

## 2023-05-16 ENCOUNTER — Encounter (HOSPITAL_COMMUNITY): Payer: Self-pay | Admitting: General Surgery

## 2023-06-12 ENCOUNTER — Ambulatory Visit (INDEPENDENT_AMBULATORY_CARE_PROVIDER_SITE_OTHER): Payer: Medicaid Other | Admitting: General Surgery

## 2023-06-12 ENCOUNTER — Encounter: Payer: Self-pay | Admitting: General Surgery

## 2023-06-12 VITALS — BP 166/98 | HR 83 | Temp 98.3°F | Resp 22 | Ht 70.0 in | Wt 183.0 lb

## 2023-06-12 DIAGNOSIS — K429 Umbilical hernia without obstruction or gangrene: Secondary | ICD-10-CM | POA: Diagnosis not present

## 2023-06-12 NOTE — Progress Notes (Signed)
[facility]  Doing well. Some soreness at the umbilical site.   BP (!) 166/98 (BP Location: Right Arm, Patient Position: Sitting, Cuff Size: Normal)   Pulse 83   Temp 98.3 F (36.8 C) (Oral)   Resp (!) 22   Ht 5\' 10"  (1.778 m)   Wt 183 lb (83 kg)   SpO2 95%   BMI 26.26 kg/m  Site healing, no erythema or drainage, indurated but no signs of recurrence, no recurrence with valsalva   Patient s/p recurrent umbilical hernia repair with suture. Doing well.   Try to lifting lifting >15 lbs for next 2 weeks.  The area should continue to heal.  Will see you back in 4 weeks. If you have issues you can come to the appt, if not you can cancel it.   Algis Greenhouse, MD [facility] [address] [place], [place] [address] [phone] (office)

## 2023-06-12 NOTE — Patient Instructions (Signed)
Try to lifting lifting >15 lbs for next 2 weeks.  The area should continue to heal.  Will see you back in 4 weeks. If you have issues you can come to the appt, if not you can cancel it.

## 2023-07-18 ENCOUNTER — Encounter: Payer: Self-pay | Admitting: General Surgery

## 2023-07-18 ENCOUNTER — Ambulatory Visit (INDEPENDENT_AMBULATORY_CARE_PROVIDER_SITE_OTHER): Payer: Medicaid Other | Admitting: General Surgery

## 2023-07-18 VITALS — BP 159/99 | HR 83 | Temp 98.4°F | Resp 18 | Ht 70.0 in | Wt 189.0 lb

## 2023-07-18 DIAGNOSIS — K429 Umbilical hernia without obstruction or gangrene: Secondary | ICD-10-CM

## 2023-07-18 NOTE — Progress Notes (Signed)
[facility]  Doing well. Gets sore at times. Working in [employer]. Report some hemorrhoidal bleeding at times and that he wants to make sure he has all of his screening things done for his age. Says he wants to cut back on drinking.  BP (!) 159/99   Pulse 83   Temp 98.4 F (36.9 C) (Oral)   Resp 18   Ht 5\' 10"  (1.778 m)   Wt 189 lb (85.7 kg)   SpO2 95%   BMI 27.12 kg/m  Umbilical site healed, no hernia   Patient s/p primary repair hernia 5mm defect. Doing well.    Will refer you to Gi for colonoscopy and check on liver given your alcohol history. See Dr. Olena Leatherwood, Myra Gianotti, MD for other check up things.  Call with issues. Diet and activity as tolerated.   Algis Greenhouse, MD [facility] [address] [place], [place] [address] [phone] (office)

## 2023-07-18 NOTE — Patient Instructions (Addendum)
Will refer you to Gi for colonoscopy and check on liver. See Dr. Olena Leatherwood, Myra Gianotti, MD for other check up things.  Call with issues. Diet and activity as tolerated.

## 2023-07-19 ENCOUNTER — Ambulatory Visit: Payer: Medicaid Other | Admitting: Gastroenterology

## 2023-08-08 NOTE — Progress Notes (Addendum)
Referring Provider: Lucretia Roers, MD  Primary Care Physician:  Toma Deiters, MD Primary GI Physician: Dr. Levon Hedger  Chief Complaint  Patient presents with   New Patient (Initial Visit)    Consult for colonoscopy , liver disease and hemorrhoids    HPI:   Derek Shields is a 52 y.o. male presenting today at the request of Lucretia Roers, MD for screening colonoscopy.  Liver disease and alcohol use also indicated on referral.   Recent CMP 04/24/2023 with AST 69, ALT 64. CT A/P with contrast 04/18/23 with hepatic steatosis, normal spleen, no ascites.  No colonoscopy on file.  Flexible sigmoidoscopy 11/17/2022 due to foreign body in the rectum.  He was found to have during the first proper in the rectosigmoid colon, but removal was not achieved.  He had nonbleeding ulcerated mucosa with stigmata of recent bleeding in the rectum.  Today: No bowel issues.  Reports bowel movements daily.  No rectal bleeding or melena.  He does have hemorrhoids.   Denies abdominal pain, nausea, vomiting, heartburn, or dysphagia.   Thinks dad had colon cancer. Had some intestinal surgery in his 15s.  Later died from bone cancer.  States he is an alcoholic. Drinks 3-4, 24 oz beer a day.  Drink alcohol this morning before coming to his appointment.  No IV drug use. Marijuana and cocaine intermittently. Last cocaine use was 2 weeks ago.     Past Medical History:  Diagnosis Date   Substance abuse (HCC)    ETOH, marijuana, cocaine.    Past Surgical History:  Procedure Laterality Date   FLEXIBLE SIGMOIDOSCOPY N/A 11/17/2022   Procedure: FLEXIBLE SIGMOIDOSCOPY;  Surgeon: Dolores Frame, MD;  [facility];  Service: Gastroenterology;  Laterality: N/A;   HERNIA REPAIR     LAPAROTOMY N/A 11/17/2022   Procedure: rectal exam under anesthesia ,;  Surgeon: Lucretia Roers, MD;  Location: [facility] ORS;  Service: General;  Laterality: N/A;   UMBILICAL HERNIA REPAIR N/A 05/13/2023    Procedure: PRIMARY ADULT UMBILICAL HERNIA REPAIR;  Surgeon: Lucretia Roers, MD;  Location: [facility] ORS;  Service: General;  Laterality: N/A;    No current outpatient medications on file.   No current facility-administered medications for this visit.    Allergies as of 08/09/2023   (No Known Allergies)    Family History  Problem Relation Age of Onset   Cancer Father        possibly colon cancer. Patietn reports intestinal surgery.    Social History   Socioeconomic History   Marital status: Single    Spouse name: Not on file   Number of children: Not on file   Years of education: Not on file   Highest education level: Not on file  Occupational History   Not on file  Tobacco Use   Smoking status: Every Day    Current packs/day: 1.50    Types: Cigarettes   Smokeless tobacco: Never  Vaping Use   Vaping status: Never Used  Substance and Sexual Activity   Alcohol use: Yes    Alcohol/week: 84.0 standard drinks of alcohol    Types: 84 Cans of beer per week    Comment: 3-4 24 oz beer daily   Drug use: Yes    Frequency: 7.0 times per week    Types: Marijuana, Cocaine    Comment: Cocaine use is 2-3 times a month   Sexual activity: Not on file  Other Topics Concern   Not on  file  Social History Narrative   Not on file   Social Determinants of Health   Financial Resource Strain: Not on file  Food Insecurity: Not on file  Transportation Needs: Not on file  Physical Activity: Not on file  Stress: Not on file  Social Connections: Not on file    Review of Systems: Gen: Denies fever, chills, cold or flulike symptoms, presyncope, syncope. Admits to hot flash sensation and headache, but states this is normal for him every morning. CV: Denies chest pain, palpitations. Resp: Denies dyspnea, cough.  GI: See HPI Derm: Denies rash. Psych: Denies depression, anxiety. Heme: See HPI  Physical Exam: BP (!) 170/114   Pulse 99   Temp 99.5 F (37.5 C)   Ht 5\' 11"  (1.803 m)    Wt 188 lb 9.6 oz (85.5 kg)   BMI 26.30 kg/m  General:   Alert and oriented. No distress noted. Pleasant and cooperative.  Head:  Normocephalic and atraumatic. Eyes:  Conjuctiva clear without scleral icterus. Heart:  S1, S2 present without murmurs appreciated. Lungs:  Clear to auscultation bilaterally. No wheezes, rales, or rhonchi. No distress.  Abdomen:  +BS, soft, non-tender and non-distended. No rebound or guarding. No HSM or masses noted. Msk:  Symmetrical without gross deformities. Normal posture. Extremities:  Without edema. Neurologic:  Alert and  oriented x4 Psych:  Normal mood and affect.    Assessment:  52 year old male with history of alcohol abuse, HTN, presenting today to discuss scheduling screening colonoscopy.  Also discussed elevated liver enzymes and hypertension.  Colon cancer screening: Overdue for first-ever screening colonoscopy.  Denies any significant lower GI symptoms or alarm symptoms.  Thinks that his father had colon cancer in his 79s requiring "intestinal surgery".  Unfortunately, due to significantly elevated BP, unable to schedule colonoscopy today.  Hypertension: BP quite elevated today.  Upon rechecking blood pressure after patient had time to rest after our visit, BP was 170/114.  Reports sensation of a hot flash and also headache, but states this is normal for him every morning.  He is not currently on blood pressure medications.  Per chart review, BP is chronically elevated.  Due to hypertensive urgency, recommended ER evaluation.  Also recommended contacting Dr. Olena Leatherwood ASAP for BP management.  Elevated LFTs: Mild LFT elevation noted on labs in May 2024 with AST 69, ALT 64.  He does drink about 96 ounces of beer daily.  Also with history of cocaine use.  He has no symptoms of decompensated liver disease.  Recent CT in April with no evidence of cirrhosis.  He does have hepatic steatosis.  I will plan to repeat LFTs today, CBC for platelets, and screen  for viral hepatitis.  He was counseled extensively on the importance of working towards complete alcohol cessation which he thinks he can do on his own.  Will recheck LFTs at his next visit.   Plan:  HFP, hepatitis B surface antibody, hepatitis B surface antigen, evidence B core antibody total, hepatitis C antibody, CBC. Counseled on the importance of alcohol cessation. Low fat/cholesterol diet.   Avoid sweets, sodas, fruit juices, sweetened beverages like tea, etc Gradually increase exercise from 15 min daily up to 1 hr per day 5 days/week. Recommended ER evaluation due to significantly elevated BP with headache and "hot flash". Recommended patient contact Dr. Olena Leatherwood ASAP for BP management. Follow-up in 3 months.  Will repeat LFTs at that time and hope to schedule colonoscopy. Will need to check urine toxicology when scheduled.  Ermalinda Memos, PA-C [facility]enterology 08/09/2023   I have reviewed the note and agree with the APP's assessment as described in this progress note  Katrinka Blazing, MD Gastroenterology and Hepatology [facility]enter[facility]

## 2023-08-09 ENCOUNTER — Encounter: Payer: Self-pay | Admitting: Gastroenterology

## 2023-08-09 ENCOUNTER — Ambulatory Visit: Payer: Medicaid Other | Admitting: Gastroenterology

## 2023-08-09 VITALS — BP 170/114 | HR 99 | Temp 99.5°F | Ht 71.0 in | Wt 188.6 lb

## 2023-08-09 DIAGNOSIS — Z1211 Encounter for screening for malignant neoplasm of colon: Secondary | ICD-10-CM | POA: Diagnosis not present

## 2023-08-09 DIAGNOSIS — R7989 Other specified abnormal findings of blood chemistry: Secondary | ICD-10-CM

## 2023-08-09 DIAGNOSIS — I10 Essential (primary) hypertension: Secondary | ICD-10-CM | POA: Diagnosis not present

## 2023-08-09 NOTE — Patient Instructions (Addendum)
Please have labs completed at [facility].  You blood pressure is quite elevated today meeting criteria for hypertensive urgency and this should be evaluated and managed in the emergency room. My recommendation is to proceed to [facility] at this time.   You also need to call Dr. Olena Leatherwood ASAP for management of your elevated blood pressure.  Work towards alcohol cessation.  General recommendations:  Low fat/cholesterol diet.   Avoid sweets, sodas, fruit juices, sweetened beverages like tea, etc. Gradually increase exercise from 15 min daily up to 1 hr per day 5 days/week.   We will follow-up with you in 3 months to discuss scheduling her colonoscopy and for elevated liver enzymes.  Ermalinda Memos, PA-C [facility] Gastroenterology

## 2023-08-12 ENCOUNTER — Encounter: Payer: Self-pay | Admitting: Gastroenterology

## 2023-10-10 ENCOUNTER — Encounter: Payer: Self-pay | Admitting: Gastroenterology

## 2023-12-04 NOTE — Progress Notes (Addendum)
Referring Provider: Toma Deiters, MD Primary Care Physician:  Toma Deiters, MD Primary GI Physician: Dr. Levon Hedger  Chief Complaint  Patient presents with   Colonoscopy    Colonoscopy screening    HPI:   Derek Shields is a 52 y.o. male with history of alcohol abuse, illicit drug use,  HTN, hepatic steatosis, elevated LFTs, presenting today for follow-up of elevated LFTs and to discuss scheduling screening colonoscopy.   No prior colonoscopy. Flex sig 11/17/22 for foreign body. He was found to have foreign body in recto-sigmoid colon with unsuccessful removal. Also with mucosal ulceration in the rectum.   Last seen in our office 08/09/2023.  Denied any GI symptoms.  Reported dad may have had colon cancer requiring some intestinal surgery in his 81s.  Regarding mildly elevated LFTs, he reported drinking 3-4, 24 ounce beer a day and had actually drank that morning before coming to his appointment.  Also had marijuana and cocaine use intermittently with last cocaine use 2 weeks ago.  Blood pressure was significantly elevated at 170/114.  Recommended proceeding to the ER to get his blood pressure lowered ASAP and to follow-up with PCP for BP management.  Regarding elevated LFTs, suspected this was secondary to alcohol, but plan to screen for viral hepatitis.  Discussed working towards complete alcohol cessation which patient felt he could do on his own.  Needed first ever getting colonoscopy, but held off due to uncontrolled HTN.  Labs were never completed.  Today: Blood pressure much improved with spironolactone.  States he feels better.  No GI concerns.  Denies abdominal pain, constipation, BRBPR, melena, nausea, vomiting, reflux symptoms, jaundice, mental status changes.  ETOH: Miller; 6-8 12 oz beer every evening.  Cocaine: None in the last 3 weeks.   No over-the-counter supplements, herbal teas, Tylenol.  Past Medical History:  Diagnosis Date   Substance abuse (HCC)     ETOH, marijuana, cocaine.    Past Surgical History:  Procedure Laterality Date   FLEXIBLE SIGMOIDOSCOPY N/A 11/17/2022   Procedure: FLEXIBLE SIGMOIDOSCOPY;  Surgeon: Dolores Frame, MD;  [facility];  Service: Gastroenterology;  Laterality: N/A;   HERNIA REPAIR     LAPAROTOMY N/A 11/17/2022   Procedure: rectal exam under anesthesia ,;  Surgeon: Lucretia Roers, MD;  Location: [facility] ORS;  Service: General;  Laterality: N/A;   UMBILICAL HERNIA REPAIR N/A 05/13/2023   Procedure: PRIMARY ADULT UMBILICAL HERNIA REPAIR;  Surgeon: Lucretia Roers, MD;  Location: [facility] ORS;  Service: General;  Laterality: N/A;    Current Outpatient Medications  Medication Sig Dispense Refill   spironolactone (ALDACTONE) 25 MG tablet Take 25 mg by mouth daily.     No current facility-administered medications for this visit.    Allergies as of 12/05/2023   (No Known Allergies)    Family History  Problem Relation Age of Onset   Cancer Father        possibly colon cancer. Patietn reports intestinal surgery.    Social History   Socioeconomic History   Marital status: Single    Spouse name: Not on file   Number of children: Not on file   Years of education: Not on file   Highest education level: Not on file  Occupational History   Not on file  Tobacco Use   Smoking status: Every Day    Current packs/day: 1.50    Types: Cigarettes   Smokeless tobacco: Never  Vaping Use   Vaping status: Never Used  Substance and Sexual Activity   Alcohol use: Yes    Alcohol/week: 84.0 standard drinks of alcohol    Types: 84 Cans of beer per week    Comment: 6-8, 12 oz beer daily   Drug use: Yes    Frequency: 7.0 times per week    Types: Marijuana, Cocaine    Comment: Cocaine use is 2-3 times a month   Sexual activity: Not on file  Other Topics Concern   Not on file  Social History Narrative   Not on file   Social Drivers of Health   Financial Resource Strain: Not on file  Food  Insecurity: Not on file  Transportation Needs: Not on file  Physical Activity: Not on file  Stress: Not on file  Social Connections: Not on file    Review of Systems: Gen: Denies fever, chills, cold or flulike symptoms, presyncope, syncope. CV: Denies chest pain, palpitations. Resp: Denies dyspnea, cough. GI: See HPI Derm: Denies rash. Psych: Denies depression, anxiety. Heme: See HPI  Physical Exam: BP 139/88 (BP Location: Right Arm, Patient Position: Sitting, Cuff Size: Normal)   Pulse 76   Temp 98.1 F (36.7 C) (Temporal)   Ht 6' (1.829 m)   Wt 190 lb (86.2 kg)   BMI 25.77 kg/m  General:   Alert and oriented. No distress noted. Pleasant and cooperative.  Head:  Normocephalic and atraumatic. Eyes:  Conjuctiva clear without scleral icterus. Heart:  S1, S2 present without murmurs appreciated. Lungs:  Clear to auscultation bilaterally. No wheezes, rales, or rhonchi. No distress.  Abdomen:  +BS, soft, non-tender and non-distended. No rebound or guarding. No HSM or masses noted. Msk:  Symmetrical without gross deformities. Normal posture. Extremities:  Without edema. Neurologic:  Alert and  oriented x4 Psych:  Normal mood and affect.    Assessment:  52 year old male with history of alcohol abuse, illicit drug use, HTN, presenting today to discuss scheduling screening colonoscopy plan for elevated liver enzymes.  Colon cancer screening: Overdue for first-ever screening colonoscopy. Denies any significant lower GI symptoms or alarm symptoms. Thinks that his father had colon cancer in his 41s requiring "intestinal surgery".   Elevated LFTs: Mild LFT elevation noted on labs in May 2024 with AST 69, ALT 64.  He does drink anywhere from 72 to 96 ounces of beer daily.  Also with history of cocaine use. He has no symptoms of decompensated liver disease. Recent CT in April with no evidence of cirrhosis. He does have hepatic steatosis.  I will plan to repeat LFTs today, CBC for  platelets, and screen for viral hepatitis as previously recommended. He was counseled on the importance of working towards complete alcohol cessation.  Plan:  Proceed with colonoscopy with propofol by Dr. Levon Hedger in the near future. The risks, benefits, and alternatives have been discussed with the patient in detail. The patient states understanding and desires to proceed.  ASA 3 UDS.  HFP, CBC, hepatitis B surface antibody, hepatitis B surface antigen, hepatitis B core antibody total, hepatitis C antibody. Work towards alcohol cessation. Advised that he will need to avoid cocaine for at minimum 2 weeks prior to colonoscopy but should abstain completely.  Follow-up TBD. If LFTs elevated, will follow-up in 3 months.    Ermalinda Memos, PA-C [facility] Gastroenterology 12/05/2023   I have reviewed the note and agree with the APP's assessment as described in this progress note  Katrinka Blazing, MD Gastroenterology and Hepatology [facility] Gastroenterology

## 2023-12-05 ENCOUNTER — Encounter: Payer: Self-pay | Admitting: Gastroenterology

## 2023-12-05 ENCOUNTER — Other Ambulatory Visit: Payer: Self-pay | Admitting: *Deleted

## 2023-12-05 ENCOUNTER — Encounter: Payer: Self-pay | Admitting: *Deleted

## 2023-12-05 ENCOUNTER — Ambulatory Visit: Payer: Medicaid Other | Admitting: Gastroenterology

## 2023-12-05 VITALS — BP 139/88 | HR 76 | Temp 98.1°F | Ht 72.0 in | Wt 190.0 lb

## 2023-12-05 DIAGNOSIS — F1090 Alcohol use, unspecified, uncomplicated: Secondary | ICD-10-CM | POA: Diagnosis not present

## 2023-12-05 DIAGNOSIS — F1490 Cocaine use, unspecified, uncomplicated: Secondary | ICD-10-CM

## 2023-12-05 DIAGNOSIS — Z1211 Encounter for screening for malignant neoplasm of colon: Secondary | ICD-10-CM

## 2023-12-05 DIAGNOSIS — R7989 Other specified abnormal findings of blood chemistry: Secondary | ICD-10-CM

## 2023-12-05 MED ORDER — PEG 3350-KCL-NA BICARB-NACL 420 G PO SOLR: 4000.0000 mL | Freq: Once | ORAL | 0 refills | Status: AC

## 2023-12-05 NOTE — Patient Instructions (Signed)
Please have blood work completed at [facility].  We will get you scheduled to have a colonoscopy in the near future with Dr. Levon Hedger.  Ermalinda Memos, PA-C [facility] Gastroenterology

## 2023-12-06 ENCOUNTER — Encounter: Payer: Self-pay | Admitting: *Deleted

## 2023-12-06 ENCOUNTER — Telehealth: Payer: Self-pay | Admitting: *Deleted

## 2023-12-06 NOTE — Telephone Encounter (Signed)
Called pt, no answer and not able to leave VM. Pre-op appt mailed

## 2023-12-27 NOTE — Patient Instructions (Signed)
 Derek Shields  12/27/2023     @PREFPERIOPPHARMACY @   Your procedure is scheduled on 12/31/2023.    [facility] to [facility] at  1130  A.M.   Call this number if you have problems the morning of surgery:  [phone]  If you experience any cold or flu symptoms such as cough, fever, chills, shortness of breath, etc. between now and your scheduled surgery, please notify us  at the above number.   Remember:  Follow the diet and prep instructions given to you by the office.   You may drink clear liquids until 0930 am on 12/31/2023.    Clear liquids allowed are:                    Water, Juice (No red color; non-citric and without pulp; diabetics please choose diet or no sugar options), Carbonated beverages (diabetics please choose diet or no sugar options), Clear Tea (No creamer, milk, or cream, including half & half and powdered creamer), Black Coffee Only (No creamer, milk or cream, including half & half and powdered creamer), and Clear Sports drink (No red color; diabetics please choose diet or no sugar options)    Take these medicines the morning of surgery with A SIP OF WATER                                                None.    Do not wear jewelry, make-up or nail polish, including gel polish,  artificial nails, or any other type of covering on natural nails (fingers and  toes).  Do not wear lotions, powders, or perfumes, or deodorant.  Do not shave 48 hours prior to surgery.  Men may shave face and neck.  Do not bring valuables to the hospital.  [facility] is not responsible for any belongings or valuables.  Contacts, dentures or bridgework may not be worn into surgery.  Leave your suitcase in the car.  After surgery it may be brought to your room.  For patients admitted to the hospital, discharge time will be determined by your treatment team.  Patients discharged the day of surgery will not be allowed to drive home and must have someone with them for 24 hours.     Special instructions:   DO NOT smoke tobacco or vape for 24 hours before your procedure.  Please read over the following fact sheets that you were given. Anesthesia Post-op Instructions and Care and Recovery After Surgery      Colonoscopy, Adult, Care After The following information offers guidance on how to care for yourself after your procedure. Your health care provider may also give you more specific instructions. If you have problems or questions, contact your health care provider. What can I expect after the procedure? After the procedure, it is common to have: A small amount of blood in your stool for 24 hours after the procedure. Some gas. Mild cramping or bloating of your abdomen. Follow these instructions at home: Eating and drinking  Drink enough fluid to keep your urine pale yellow. Follow instructions from your health care provider about eating or drinking restrictions. Resume your normal diet as told by your health care provider. Avoid heavy or fried foods that are hard to digest. Activity Rest as told by your health care provider. Avoid sitting for a long  time without moving. Get up to take short walks every 1-2 hours. This is important to improve blood flow and breathing. Ask for help if you feel weak or unsteady. Return to your normal activities as told by your health care provider. Ask your health care provider what activities are safe for you. Managing cramping and bloating  Try walking around when you have cramps or feel bloated. If directed, apply heat to your abdomen as told by your health care provider. Use the heat source that your health care provider recommends, such as a moist heat pack or a heating pad. Place a towel between your skin and the heat source. Leave the heat on for 20-30 minutes. Remove the heat if your skin turns bright red. This is especially important if you are unable to feel pain, heat, or cold. You have a greater risk of getting  burned. General instructions If you were given a sedative during the procedure, it can affect you for several hours. Do not drive or operate machinery until your health care provider says that it is safe. For the first 24 hours after the procedure: Do not sign important documents. Do not drink alcohol. Do your regular daily activities at a slower pace than normal. Eat soft foods that are easy to digest. Take over-the-counter and prescription medicines only as told by your health care provider. Keep all follow-up visits. This is important. Contact a health care provider if: You have blood in your stool 2-3 days after the procedure. Get help right away if: You have more than a small spotting of blood in your stool. You have large blood clots in your stool. You have swelling of your abdomen. You have nausea or vomiting. You have a fever. You have increasing pain in your abdomen that is not relieved with medicine. These symptoms may be an emergency. Get help right away. Call 911. Do not wait to see if the symptoms will go away. Do not drive yourself to the hospital. Summary After the procedure, it is common to have a small amount of blood in your stool. You may also have mild cramping and bloating of your abdomen. If you were given a sedative during the procedure, it can affect you for several hours. Do not drive or operate machinery until your health care provider says that it is safe. Get help right away if you have a lot of blood in your stool, nausea or vomiting, a fever, or increased pain in your abdomen. This information is not intended to replace advice given to you by your health care provider. Make sure you discuss any questions you have with your health care provider. Document Revised: 01/22/2023 Document Reviewed: 08/02/2021 Elsevier Patient Education  2024 [employer] Monitored Anesthesia Care, Care After The following information offers guidance on how to care for yourself  after your procedure. Your health care provider may also give you more specific instructions. If you have problems or questions, contact your health care provider. What can I expect after the procedure? After the procedure, it is common to have: Tiredness. Little or no memory about what happened during or after the procedure. Impaired judgment when it comes to making decisions. Nausea or vomiting. Some trouble with balance. Follow these instructions at home: For the time period you were told by your health care provider:  Rest. Do not participate in activities where you could fall or become injured. Do not drive or use machinery. Do not drink alcohol. Do not take sleeping pills or  medicines that cause drowsiness. Do not make important decisions or sign legal documents. Do not take care of children on your own. Medicines Take over-the-counter and prescription medicines only as told by your health care provider. If you were prescribed antibiotics, take them as told by your health care provider. Do not stop using the antibiotic even if you start to feel better. Eating and drinking Follow instructions from your health care provider about what you may eat and drink. Drink enough fluid to keep your urine pale yellow. If you vomit: Drink clear fluids slowly and in small amounts as you are able. Clear fluids include water, ice chips, low-calorie sports drinks, and fruit juice that has water added to it (diluted fruit juice). Eat light and bland foods in small amounts as you are able. These foods include bananas, applesauce, rice, lean meats, toast, and crackers. General instructions  Have a responsible adult stay with you for the time you are told. It is important to have someone help care for you until you are awake and alert. If you have sleep apnea, surgery and some medicines can increase your risk for breathing problems. Follow instructions from your health care provider about wearing your  sleep device: When you are sleeping. This includes during daytime naps. While taking prescription pain medicines, sleeping medicines, or medicines that make you drowsy. Do not use any products that contain nicotine or tobacco. These products include cigarettes, chewing tobacco, and vaping devices, such as e-cigarettes. If you need help quitting, ask your health care provider. Contact a health care provider if: You feel nauseous or vomit every time you eat or drink. You feel light-headed. You are still sleepy or having trouble with balance after 24 hours. You get a rash. You have a fever. You have redness or swelling around the IV site. Get help right away if: You have trouble breathing. You have new confusion after you get home. These symptoms may be an emergency. Get help right away. Call 911. Do not wait to see if the symptoms will go away. Do not drive yourself to the hospital. This information is not intended to replace advice given to you by your health care provider. Make sure you discuss any questions you have with your health care provider. Document Revised: 05/07/2022 Document Reviewed: 05/07/2022 Elsevier Patient Education  2024 [employer].

## 2023-12-30 ENCOUNTER — Encounter (HOSPITAL_COMMUNITY): Payer: Self-pay

## 2023-12-30 ENCOUNTER — Telehealth (INDEPENDENT_AMBULATORY_CARE_PROVIDER_SITE_OTHER): Payer: Self-pay | Admitting: Gastroenterology

## 2023-12-30 ENCOUNTER — Encounter (HOSPITAL_COMMUNITY)
Admission: RE | Admit: 2023-12-30 | Discharge: 2023-12-30 | Disposition: A | Discharge status: Home / Self Care | Payer: Medicaid Other | Source: Ambulatory Visit | Attending: Gastroenterology | Admitting: Gastroenterology

## 2023-12-30 DIAGNOSIS — K769 Liver disease, unspecified: Secondary | ICD-10-CM

## 2023-12-30 DIAGNOSIS — F1910 Other psychoactive substance abuse, uncomplicated: Secondary | ICD-10-CM

## 2023-12-30 NOTE — Telephone Encounter (Signed)
 Levora Randine SQUIBB, RN  Neysa Elveria LOUVENIA Gaylene, Madelin CROME, LPN; Arthurine Merle, LPN; Jeanell Graeme RAMAN, CMA; Rhenda Luke GAILS, RN Patient called to cancel his TCS for tomorrow.  He stated he has been busy with the holidays and the bad weather has caused an issue with his transportation  TCS asa 3 Derek Shields

## 2023-12-30 NOTE — Progress Notes (Signed)
 Patient called to cancel his TCS for tomorrow.  He stated he has been busy with the holidays and the bad weather has caused an issue with his transportation.

## 2023-12-31 ENCOUNTER — Encounter (HOSPITAL_COMMUNITY): Admission: RE | Payer: Self-pay | Source: Home / Self Care

## 2023-12-31 ENCOUNTER — Encounter (HOSPITAL_COMMUNITY): Payer: Self-pay | Admitting: Anesthesiology

## 2023-12-31 ENCOUNTER — Ambulatory Visit (HOSPITAL_COMMUNITY): Admission: RE | Admit: 2023-12-31 | Payer: Medicaid Other | Source: Home / Self Care | Admitting: Gastroenterology

## 2023-12-31 SURGERY — COLONOSCOPY WITH PROPOFOL
Anesthesia: Monitor Anesthesia Care

## 2023-12-31 NOTE — Telephone Encounter (Signed)
 Left message to return call

## 2024-01-21 MED ORDER — PEG 3350-KCL-NA BICARB-NACL 420 G PO SOLR: 4000.0000 mL | Freq: Once | ORAL | 0 refills | Status: AC

## 2024-01-21 NOTE — Telephone Encounter (Signed)
Contacted pt. Pt scheduled for 01/31/24. Prep sent to pharmacy. Will call with pre op. Instructions will be mailed to mothers home at [address] [place] [address].  Pa pending via availty.

## 2024-01-21 NOTE — Addendum Note (Signed)
Addended by: Marlowe Shores on: 01/21/2024 04:35 PM   Modules accepted: Orders

## 2024-01-22 ENCOUNTER — Telehealth (INDEPENDENT_AMBULATORY_CARE_PROVIDER_SITE_OTHER): Payer: Self-pay | Admitting: Gastroenterology

## 2024-01-22 NOTE — Telephone Encounter (Signed)
Left detailed message on pt voicemail letting him know of pre op appt that is scheduled for 01/24/24 at 1pm. Gave Carolyn's number also if that pre op time does not work.

## 2024-01-23 NOTE — Patient Instructions (Signed)
Derek Shields  01/23/2024        Your procedure is scheduled on 01/31/24.  [facility] at 12:15 [facility].  Call this number if you have problems the morning of surgery:  [phone]  If you experience any cold or flu symptoms such as cough, fever, chills, shortness of breath, etc. between now and your scheduled surgery, please notify us at the above number.   Remember:  Do not eat after midnight.   You may drink clear liquids until 10:15 am .  Clear liquids allowed are:  Water, Juice (No red color; non-citric and without pulp; diabetics please choose diet or no sugar options), Carbonated beverages (diabetics please choose diet or no sugar options), Clear Tea (No creamer, milk, or cream, including half & half and powdered creamer), Black Coffee Only (No creamer, milk or cream, including half & half and powdered creamer), and Clear Sports drink (No red color; diabetics please choose diet or no sugar options)    Take these medicines the morning of surgery with A SIP OF WATER -Flexeril    Do not wear jewelry, make-up or nail polish, including gel polish,  artificial nails, or any other type of covering on natural nails (fingers and  toes).  Do not wear lotions, powders, or perfumes, or deodorant.  Do not shave 48 hours prior to surgery.  Men may shave face and neck.  Do not bring valuables to the hospital.  [facility] is not responsible for any belongings or valuables.  Contacts, dentures or bridgework may not be worn into surgery.  Leave your suitcase in the car.  After surgery it may be brought to your room.  For patients admitted to the hospital, discharge time will be determined by your treatment team.  Patients discharged the day of surgery will not be allowed to drive home.    Please read over the following fact sheets that you were given. Care and Recovery After Surgery   Colonoscopy, Adult, Care After The following information offers guidance on how to care for  yourself after your procedure. Your health care provider may also give you more specific instructions. If you have problems or questions, contact your health care provider. What can I expect after the procedure? After the procedure, it is common to have: A small amount of blood in your stool for 24 hours after the procedure. Some gas. Mild cramping or bloating of your abdomen. Follow these instructions at home: Eating and drinking  Drink enough fluid to keep your urine pale yellow. Follow instructions from your health care provider about eating or drinking restrictions. Resume your normal diet as told by your health care provider. Avoid heavy or fried foods that are hard to digest. Activity Rest as told by your health care provider. Avoid sitting for a long time without moving. Get up to take short walks every 1-2 hours. This is important to improve blood flow and breathing. Ask for help if you feel weak or unsteady. Return to your normal activities as told by your health care provider. Ask your health care provider what activities are safe for you. Managing cramping and bloating  Try walking around when you have cramps or feel bloated. If directed, apply heat to your abdomen as told by your health care provider. Use the heat source that your health care provider recommends, such as a moist heat pack or a heating pad. Place a towel between your skin and the heat source. Leave the heat on  for 20-30 minutes. Remove the heat if your skin turns bright red. This is especially important if you are unable to feel pain, heat, or cold. You have a greater risk of getting burned. General instructions If you were given a sedative during the procedure, it can affect you for several hours. Do not drive or operate machinery until your health care provider says that it is safe. For the first 24 hours after the procedure: Do not sign important documents. Do not drink alcohol. Do your regular daily  activities at a slower pace than normal. Eat soft foods that are easy to digest. Take over-the-counter and prescription medicines only as told by your health care provider. Keep all follow-up visits. This is important. Contact a health care provider if: You have blood in your stool 2-3 days after the procedure. Get help right away if: You have more than a small spotting of blood in your stool. You have large blood clots in your stool. You have swelling of your abdomen. You have nausea or vomiting.Monitored Anesthesia Care, Care After The following information offers guidance on how to care for yourself after your procedure. Your health care provider may also give you more specific instructions. If you have problems or questions, contact your health care provider. What can I expect after the procedure? After the procedure, it is common to have: Tiredness. Little or no memory about what happened during or after the procedure. Impaired judgment when it comes to making decisions. Nausea or vomiting. Some trouble with balance. Follow these instructions at home: For the time period you were told by your health care provider:  Rest. Do not participate in activities where you could fall or become injured. Do not drive or use machinery. Do not drink alcohol. Do not take sleeping pills or medicines that cause drowsiness. Do not make important decisions or sign legal documents. Do not take care of children on your own. Medicines Take over-the-counter and prescription medicines only as told by your health care provider. If you were prescribed antibiotics, take them as told by your health care provider. Do not stop using the antibiotic even if you start to feel better. Eating and drinking Follow instructions from your health care provider about what you may eat and drink. Drink enough fluid to keep your urine pale yellow. If you vomit: Drink clear fluids slowly and in small amounts as you are  able. Clear fluids include water, ice chips, low-calorie sports drinks, and fruit juice that has water added to it (diluted fruit juice). Eat light and bland foods in small amounts as you are able. These foods include bananas, applesauce, rice, lean meats, toast, and crackers. General instructions  Have a responsible adult stay with you for the time you are told. It is important to have someone help care for you until you are awake and alert. If you have sleep apnea, surgery and some medicines can increase your risk for breathing problems. Follow instructions from your health care provider about wearing your sleep device: When you are sleeping. This includes during daytime naps. While taking prescription pain medicines, sleeping medicines, or medicines that make you drowsy. Do not use any products that contain nicotine or tobacco. These products include cigarettes, chewing tobacco, and vaping devices, such as e-cigarettes. If you need help quitting, ask your health care provider. Contact a health care provider if: You feel nauseous or vomit every time you eat or drink. You feel light-headed. You are still sleepy or having trouble with balance  after 24 hours. You get a rash. You have a fever. You have redness or swelling around the IV site. Get help right away if: You have trouble breathing. You have new confusion after you get home. These symptoms may be an emergency. Get help right away. Call 911. Do not wait to see if the symptoms will go away. Do not drive yourself to the hospital. This information is not intended to replace advice given to you by your health care provider. Make sure you discuss any questions you have with your health care provider. Document Revised: 05/07/2022 Document Reviewed: 05/07/2022 Elsevier Patient Education  2024 Else[employer] Inc. You have a fever. You have increasing pain in your abdomen that is not relieved with medicine. These symptoms may be an emergency. Get  help right away. Call 911. Do not wait to see if the symptoms will go away. Do not drive yourself to the hospital. Summary After the procedure, it is common to have a small amount of blood in your stool. You may also have mild cramping and bloating of your abdomen. If you were given a sedative during the procedure, it can affect you for several hours. Do not drive or operate machinery until your health care provider says that it is safe. Get help right away if you have a lot of blood in your stool, nausea or vomiting, a fever, or increased pain in your abdomen. This information is not intended to replace advice given to you by your health care provider. Make sure you discuss any questions you have with your health care provider. Document Revised: 01/22/2023 Document Reviewed: 08/02/2021 Elsevier Patient Education  2024 [employer].

## 2024-01-24 ENCOUNTER — Encounter (HOSPITAL_COMMUNITY)
Admission: RE | Admit: 2024-01-24 | Discharge: 2024-01-24 | Disposition: A | Discharge status: Home / Self Care | Payer: Medicaid Other | Source: Ambulatory Visit | Attending: Gastroenterology | Admitting: Gastroenterology

## 2024-01-24 DIAGNOSIS — F1411 Cocaine abuse, in remission: Secondary | ICD-10-CM

## 2024-01-24 DIAGNOSIS — K769 Liver disease, unspecified: Secondary | ICD-10-CM

## 2024-01-31 ENCOUNTER — Encounter (HOSPITAL_COMMUNITY): Payer: Self-pay | Admitting: Anesthesiology

## 2024-01-31 ENCOUNTER — Encounter (HOSPITAL_COMMUNITY): Admission: RE | Payer: Self-pay | Source: Home / Self Care

## 2024-01-31 ENCOUNTER — Ambulatory Visit (HOSPITAL_COMMUNITY): Admission: RE | Admit: 2024-01-31 | Payer: Medicaid Other | Source: Home / Self Care | Admitting: Gastroenterology

## 2024-01-31 SURGERY — COLONOSCOPY WITH PROPOFOL
Anesthesia: Choice

## 2024-01-31 NOTE — Progress Notes (Signed)
 Called patient and he states he is out of town working today.  He is unable to come.  Patient had some questions about lab work and I informed him he needs to touch base with Dr. Samuel office about the labs and to reschedule the colonoscopy.  Patient voiced understanding.

## 2024-02-03 ENCOUNTER — Telehealth (INDEPENDENT_AMBULATORY_CARE_PROVIDER_SITE_OTHER): Payer: Self-pay | Admitting: Gastroenterology

## 2024-02-03 NOTE — Telephone Encounter (Signed)
 Pt left voicemail to reschedule TCS. Pt was on scheduled for 01/31/24 but did not show up. This will be the 3rd time pt had reschedule. Left message on voicemail for pt to call back to get rescheduled.

## 2024-10-28 ENCOUNTER — Encounter (INDEPENDENT_AMBULATORY_CARE_PROVIDER_SITE_OTHER): Payer: Self-pay | Admitting: Gastroenterology
